# Patient Record
Sex: Male | Born: 1985
Health system: Southern US, Community
[De-identification: ages and names within clinical notes are randomized; demographics above are authoritative.]

## PROBLEM LIST (undated history)

## (undated) DIAGNOSIS — F419 Anxiety disorder, unspecified: Secondary | ICD-10-CM

## (undated) DIAGNOSIS — R011 Cardiac murmur, unspecified: Secondary | ICD-10-CM

## (undated) DIAGNOSIS — K219 Gastro-esophageal reflux disease without esophagitis: Secondary | ICD-10-CM

## (undated) DIAGNOSIS — T7840XA Allergy, unspecified, initial encounter: Secondary | ICD-10-CM

## (undated) HISTORY — PX: VASECTOMY: SHX75

## (undated) HISTORY — DX: Allergy, unspecified, initial encounter: T78.40XA

## (undated) HISTORY — DX: Anxiety disorder, unspecified: F41.9

## (undated) HISTORY — DX: Gastro-esophageal reflux disease without esophagitis: K21.9

## (undated) HISTORY — DX: Cardiac murmur, unspecified: R01.1

---
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
## 2007-02-27 ENCOUNTER — Encounter (INDEPENDENT_AMBULATORY_CARE_PROVIDER_SITE_OTHER): Payer: Self-pay | Admitting: Internal Medicine

## 2007-02-27 ENCOUNTER — Inpatient Hospital Stay (HOSPITAL_COMMUNITY): Admission: EM | Admit: 2007-02-27 | Discharge: 2007-03-01 | Payer: Self-pay | Admitting: Emergency Medicine

## 2007-06-25 ENCOUNTER — Ambulatory Visit: Payer: Self-pay | Admitting: Internal Medicine

## 2007-08-07 ENCOUNTER — Emergency Department: Payer: Self-pay | Admitting: Emergency Medicine

## 2007-12-10 ENCOUNTER — Observation Stay: Payer: Self-pay | Admitting: Internal Medicine

## 2008-04-26 IMAGING — CR DG LUMBAR SPINE AP/LAT/OBLIQUES W/ FLEX AND EXT
1 series · 5 of 5 positions shown · non-contrast
Comparison: none

REASON FOR EXAM: Pain
COMMENTS:

PROCEDURE:     DXR - DXR LUMBAR SPINE WITH OBLIQUES  - June 25, 2007  [DATE]
RESULT:     Five non-rib bearing lumbar vertebral bodies are appreciated.
There does not appear to be evidence of fracture, dislocation or
malalignment. Air is seen within non-dilated loops of bowel.

[Series 1: view not recorded · 0.17mm/px · 5 of 5 slices shown]
[im 1/5]
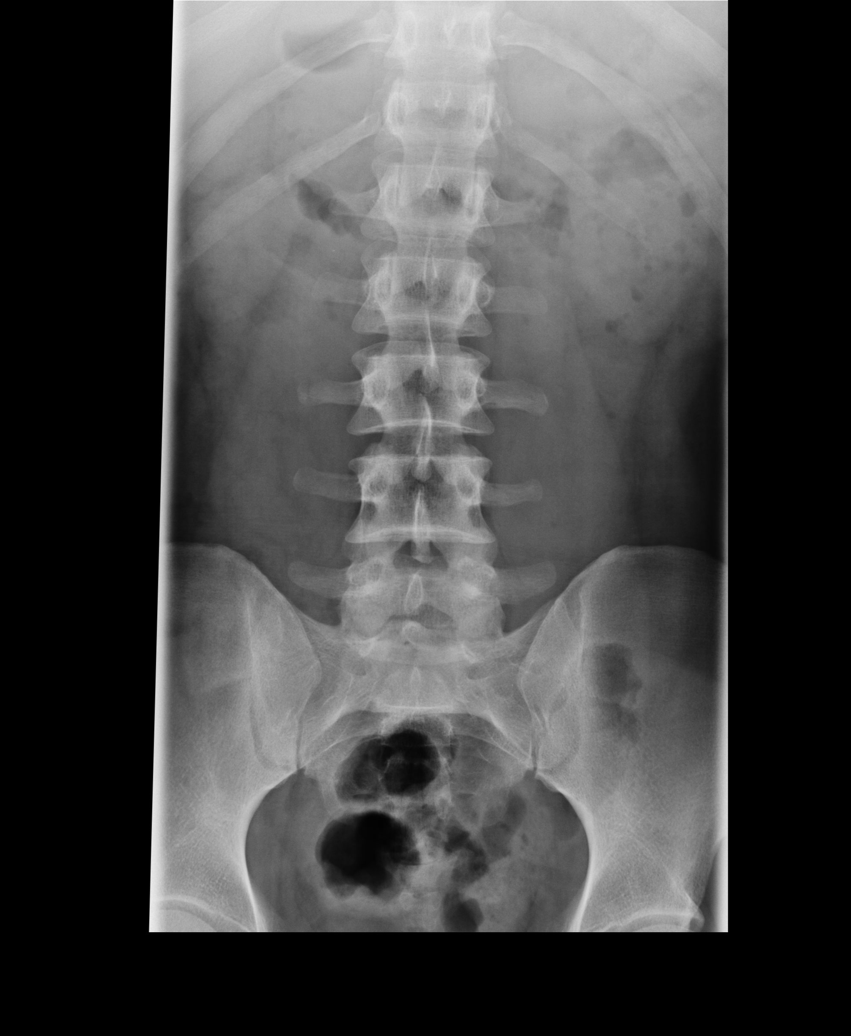
[im 2/5]
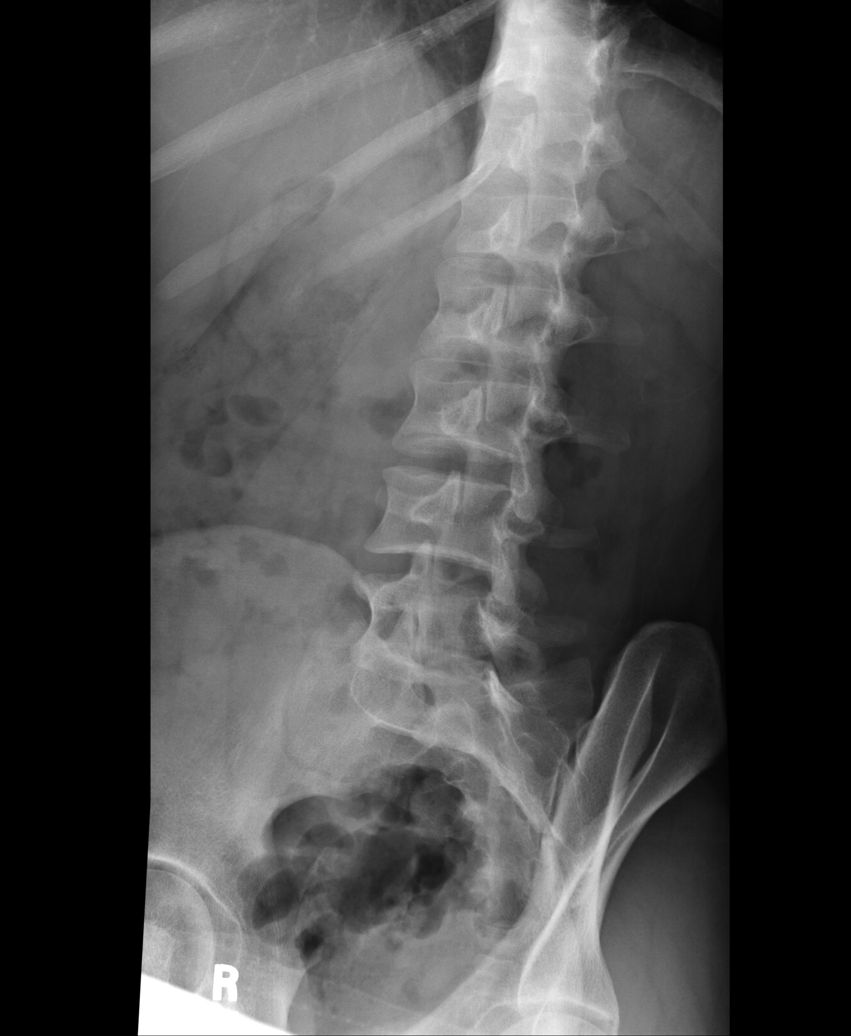
[im 3/5]
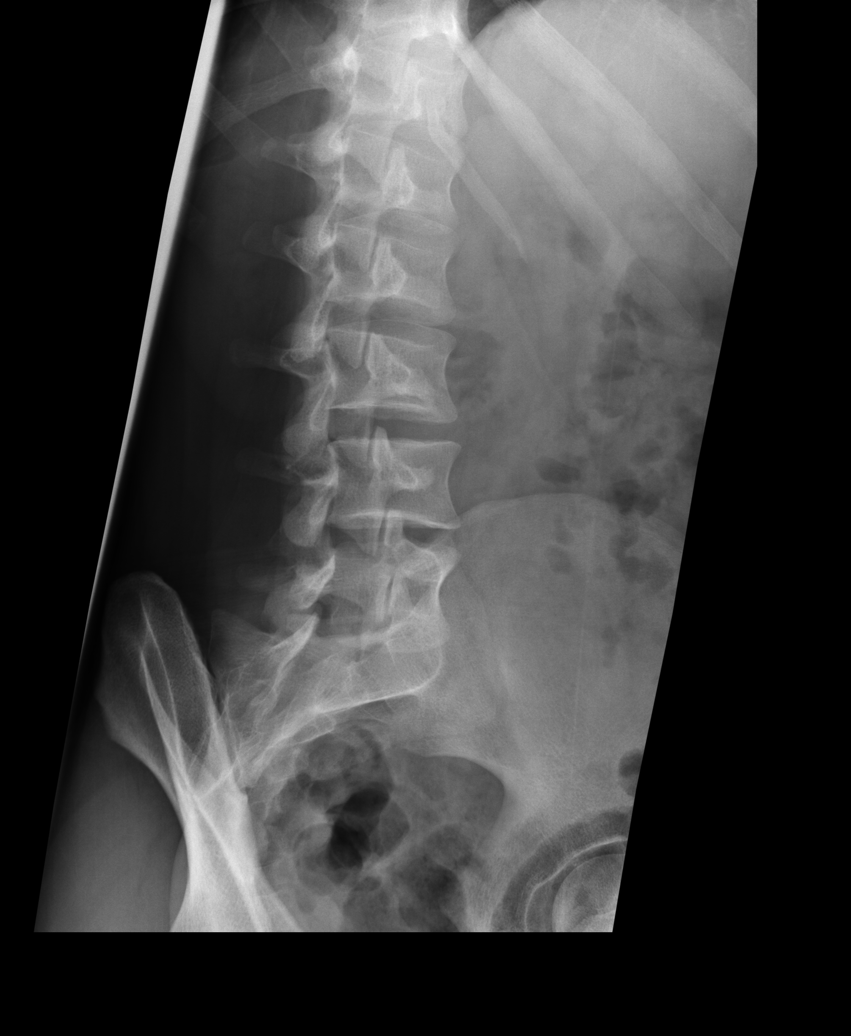
[im 4/5]
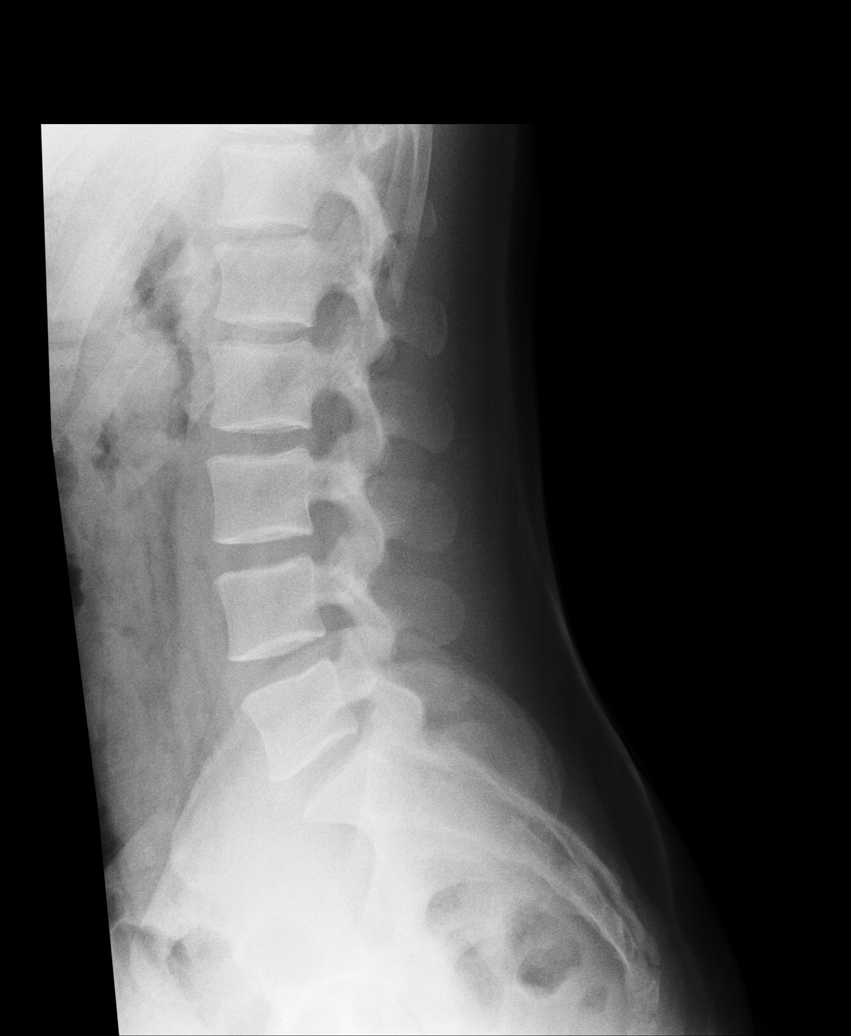
[im 5/5]
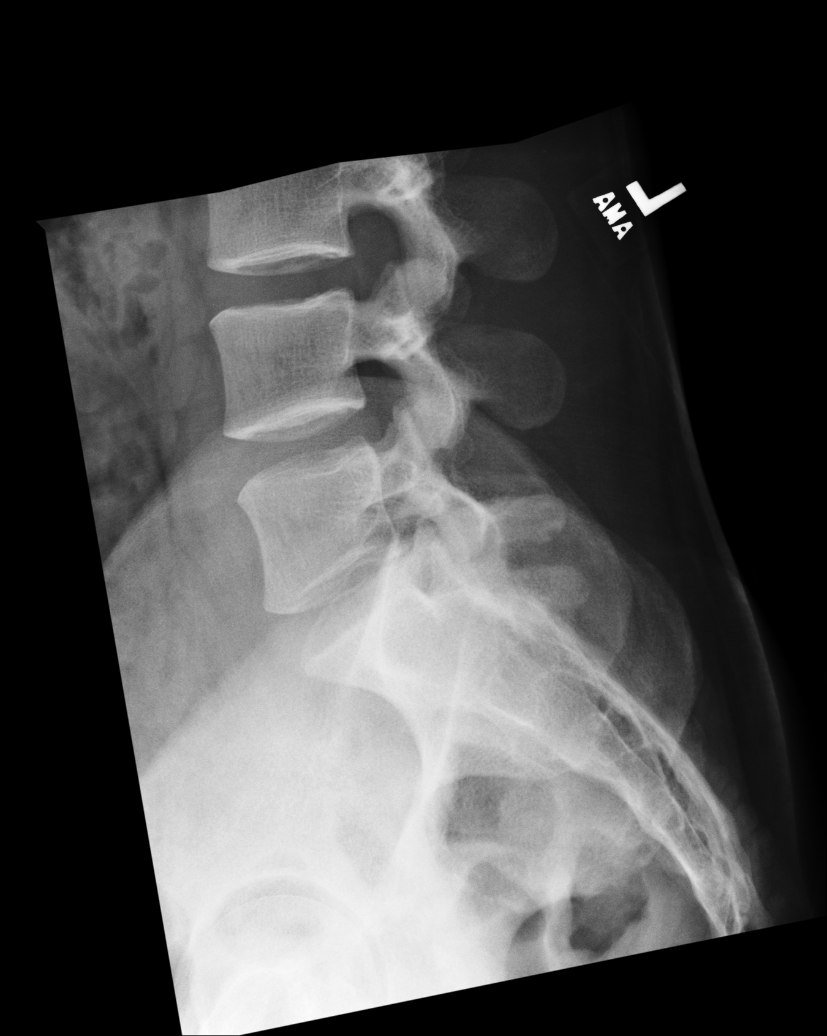

[5 of 5 positions shown; findings below may reference images not displayed]

IMPRESSION: 1.     Unremarkable lumbar spine.
2.     If there is persistent clinical concern or persistent complaints of
pain, repeat evaluation in 7-10 days is recommended or, if clinically
warranted, further evaluation with lumbar MRI.

## 2008-06-20 ENCOUNTER — Emergency Department: Payer: Self-pay | Admitting: Emergency Medicine

## 2008-09-22 ENCOUNTER — Emergency Department (HOSPITAL_COMMUNITY): Admission: EM | Admit: 2008-09-22 | Discharge: 2008-09-23 | Payer: Self-pay | Admitting: Emergency Medicine

## 2008-09-23 ENCOUNTER — Inpatient Hospital Stay (HOSPITAL_COMMUNITY): Admission: AD | Admit: 2008-09-23 | Discharge: 2008-09-29 | Payer: Self-pay | Admitting: Psychiatry

## 2008-09-23 ENCOUNTER — Ambulatory Visit: Payer: Self-pay | Admitting: Psychiatry

## 2008-09-25 ENCOUNTER — Emergency Department (HOSPITAL_COMMUNITY): Admission: EM | Admit: 2008-09-25 | Discharge: 2008-09-25 | Payer: Self-pay | Admitting: Emergency Medicine

## 2008-09-28 ENCOUNTER — Emergency Department (HOSPITAL_COMMUNITY): Admission: EM | Admit: 2008-09-28 | Discharge: 2008-09-28 | Payer: Self-pay | Admitting: Emergency Medicine

## 2009-05-23 ENCOUNTER — Emergency Department (HOSPITAL_COMMUNITY): Admission: EM | Admit: 2009-05-23 | Discharge: 2009-05-24 | Payer: Self-pay | Admitting: Emergency Medicine

## 2010-10-06 LAB — DIFFERENTIAL
Basophils Relative: 0 % (ref 0–1)
Eosinophils Absolute: 0.1 10*3/uL (ref 0.0–0.7)
Eosinophils Relative: 1 % (ref 0–5)
Monocytes Relative: 7 % (ref 3–12)
Neutrophils Relative %: 53 % (ref 43–77)

## 2010-10-06 LAB — COMPREHENSIVE METABOLIC PANEL
ALT: 18 U/L (ref 0–53)
AST: 23 U/L (ref 0–37)
Alkaline Phosphatase: 70 U/L (ref 39–117)
CO2: 29 mEq/L (ref 19–32)
GFR calc non Af Amer: >60 mL/min (ref >60)
Glucose, Bld: 66 mg/dL — ABNORMAL LOW (ref 70–99)
Potassium: 3.7 mEq/L (ref 3.5–5.1)
Sodium: 135 mEq/L (ref 135–145)
Total Protein: 7.6 g/dL (ref 6.0–8.3)

## 2010-10-06 LAB — CBC
Hemoglobin: 15.7 g/dL (ref 13.0–17.0)
RBC: 4.94 MIL/uL (ref 4.22–5.81)

## 2010-10-06 LAB — RAPID URINE DRUG SCREEN, HOSP PERFORMED
Opiates: NOT DETECTED
Tetrahydrocannabinol: NOT DETECTED

## 2010-10-06 LAB — ETHANOL: Alcohol, Ethyl (B): <5 mg/dL (ref 0–10)

## 2010-10-14 LAB — COMPREHENSIVE METABOLIC PANEL
ALT: 28 U/L (ref 0–53)
Alkaline Phosphatase: 56 U/L (ref 39–117)
BUN: 13 mg/dL (ref 6–23)
CO2: 27 mEq/L (ref 19–32)
Chloride: 105 mEq/L (ref 96–112)
Glucose, Bld: 94 mg/dL (ref 70–99)
Potassium: 3.6 mEq/L (ref 3.5–5.1)
Sodium: 140 mEq/L (ref 135–145)
Total Bilirubin: 0.6 mg/dL (ref 0.3–1.2)
Total Protein: 7.1 g/dL (ref 6.0–8.3)

## 2010-10-14 LAB — ETHANOL: Alcohol, Ethyl (B): <5 mg/dL (ref 0–10)

## 2010-10-14 LAB — RPR: RPR Ser Ql: NONREACTIVE

## 2010-10-14 LAB — DIFFERENTIAL
Basophils Absolute: 0 10*3/uL (ref 0.0–0.1)
Basophils Absolute: 0 10*3/uL (ref 0.0–0.1)
Basophils Relative: 0 % (ref 0–1)
Basophils Relative: 0 % (ref 0–1)
Eosinophils Absolute: 0.1 10*3/uL (ref 0.0–0.7)
Eosinophils Absolute: 0.1 10*3/uL (ref 0.0–0.7)
Monocytes Absolute: 0.8 10*3/uL (ref 0.1–1.0)
Monocytes Relative: 8 % (ref 3–12)
Neutro Abs: 6.5 10*3/uL (ref 1.7–7.7)
Neutro Abs: 4.6 10*3/uL (ref 1.7–7.7)
Neutrophils Relative %: 62 % (ref 43–77)
Neutrophils Relative %: 58 % (ref 43–77)

## 2010-10-14 LAB — RAPID URINE DRUG SCREEN, HOSP PERFORMED
Barbiturates: NOT DETECTED
Cocaine: NOT DETECTED
Opiates: NOT DETECTED
Tetrahydrocannabinol: NOT DETECTED

## 2010-10-14 LAB — CK: Total CK: 1567 U/L — ABNORMAL HIGH (ref 7–232)

## 2010-10-14 LAB — POCT I-STAT, CHEM 8
Chloride: 104 mEq/L (ref 96–112)
Creatinine, Ser: 1.5 mg/dL (ref 0.4–1.5)
Glucose, Bld: 92 mg/dL (ref 70–99)
HCT: 51 % (ref 39.0–52.0)
HCT: 45 % (ref 39.0–52.0)
Hemoglobin: 17.3 g/dL — ABNORMAL HIGH (ref 13.0–17.0)
Hemoglobin: 15.3 g/dL (ref 13.0–17.0)
Potassium: 3.5 mEq/L (ref 3.5–5.1)
Potassium: 4.1 mEq/L (ref 3.5–5.1)
Sodium: 142 mEq/L (ref 135–145)
Sodium: 139 mEq/L (ref 135–145)
TCO2: 26 mmol/L (ref 0–100)

## 2010-10-14 LAB — URINALYSIS, ROUTINE W REFLEX MICROSCOPIC
Bilirubin Urine: NEGATIVE
Glucose, UA: NEGATIVE mg/dL
Hgb urine dipstick: NEGATIVE
Specific Gravity, Urine: 1.022 (ref 1.005–1.030)
Urobilinogen, UA: 0.2 mg/dL (ref 0.0–1.0)
pH: 6.5 (ref 5.0–8.0)

## 2010-10-14 LAB — CBC
HCT: 46.8 % (ref 39.0–52.0)
Hemoglobin: 16.3 g/dL (ref 13.0–17.0)
Hemoglobin: 14.4 g/dL (ref 13.0–17.0)
MCHC: 34 g/dL (ref 30.0–36.0)
RBC: 5.07 MIL/uL (ref 4.22–5.81)
RDW: 13.7 % (ref 11.5–15.5)
WBC: 10.6 10*3/uL — ABNORMAL HIGH (ref 4.0–10.5)

## 2010-10-14 LAB — ACETAMINOPHEN LEVEL: Acetaminophen (Tylenol), Serum: <10 ug/mL — ABNORMAL LOW (ref 10–30)

## 2010-10-14 LAB — TSH: TSH: 2.614 u[IU]/mL (ref 0.350–4.500)

## 2010-10-14 LAB — T4, FREE: Free T4: 1.27 ng/dL (ref 0.89–1.80)

## 2010-11-16 NOTE — Discharge Summary (Signed)
[name], [name]                ACCOUNT NO.:  [id number]   MEDICAL RECORD NO.:  [id number]          PATIENT TYPE:  INP   LOCATION:  4733                         FACILITY:  [facility]   PHYSICIAN:  Mobolaji B. Bakare, M.D.DATE OF BIRTH:  1986/06/17   DATE OF ADMISSION:  02/26/2007  DATE OF DISCHARGE:                               DISCHARGE SUMMARY   PRIMARY CARE PHYSICIAN:  Dr. Beverely Risen in [place], [place].   FINAL DIAGNOSES:  1. Vasovagal syncope.  2. Migraine headaches.  3. Sinus bradycardia, asymptomatic.   PROCEDURES:  1. Head CT scan done on February 27, 2007, showed no acute intracranial      abnormality.  2. MRI of the head done on February 27, 2007, showed no acute      abnormality; it was a normal study.  3. EEG done on February 27, 2007, was a normal study in the awake,      drowsy and light sleep states.  4. A 2-D echocardiogram done on February 27, 2007, reported normal left      ventricular systolic function with estimated ejection fraction of      55-65%.  There was right ventricular enlargement but normal RV      pressure and normal RV systolic function.   BRIEF HISTORY:  [name] [name] is a pleasant 25 year old Caucasian male who  was visiting his girlfriend in [place].  About 8:45 p.m. on the day  of admission the patient was noted to have had a blackout while sitting.  This period of blackout/unresponsiveness appears to have been less than  1 minute.  The patient came around and developed a frontal headache.  No  change in his vision.  He had no nausea/vomiting.  During the course of  hospitalization he had photophobia and phonophobia.  He does have  history of migraine headaches.   Upon arrival in the emergency room, temperature was 99.6, blood pressure  127/79, blood pressure 98/45, pulse of 62, he was in sinus rhythm, O2  saturations of 98%.  Physical examination was unremarkable.  He was  oriented in time, place and person.  He underwent head CT scan  which was  normal and the patient was admitted to telemetry for further evaluation.  His initial EKG showed sinus bradycardia, 50 beats per minute, with  early repolarization, no acute ischemic changes.   HOSPITAL COURSE:  #1 - VASOVAGAL SYNCOPE.  The patient was admitted for  syncopal workup.  He was not orthostatic.  He was on telemetry for the  period of hospitalization.  There was no malignant arrhythmia on  telemetry.  He had sinus bradycardia but this apparently was  asymptomatic.  His heart rate was in the mid 50s.  The patient had a 2-D  echocardiogram which showed a dilated right ventricle but normal  pressure and function.  Right ventricular dysplasia was a consideration  in the setting of syncope.  However, the patient did not demonstrate any  ventricular tachycardia on telemetry.   Given the history of headaches surrounding the onset of syncope, it was  felt that this could represent vasovagal  syncope.  He did not have any  recurrence of symptoms during the course of hospitalization.   #2 - MIGRAINE HEADACHES.  The patient does have a history of migraine  and he gets relief from Imitrex.  He was given Imitrex and Vicodin  p.r.n.  Headaches have resolved, no more phonophobia or photophobia.   DISCHARGE CONDITION:  Stable.   VITAL SIGNS AT THE TIME OF DISCHARGE:  Temperature 97, pulse 69, O2  saturations 96% on room air, blood pressure 126/57, respiratory rate 20.   DISCHARGE MEDICATIONS:  1. Vicodin one to two p.o. p.r.n. q.4h.  2. Zantac 150 mg daily.  3. Vitamin one daily.   FOLLOWUP:  With Dr. Welton Flakes.      Mobolaji B. Corky Downs, M.D.  Electronically Signed     MBB/MEDQ  D:  03/01/2007  T:  03/02/2007  Job:  [id number]   cc:   Beverely Risen, MD

## 2010-11-16 NOTE — H&P (Signed)
NAMEKARSIN, PESTA NO.:  [id number]   MEDICAL RECORD NO.:  [id number]          PATIENT TYPE:  IPS   LOCATION:  0501                          [facility]ACILITY[facility]  [facility]   PHYSICIAN:  Geoffery Lyons, M.D.      DATE OF BIRTH:  04-May-1986   DATE OF ADMISSION:  09/23/2008  DATE OF DISCHARGE:                       PSYCHIATRIC ADMISSION ASSESSMENT   IDENTIFYING INFORMATION:  This is a 25 year old male.  This is a  voluntary admission.   HISTORY OF THE PRESENT ILLNESS:  First inpatient psychiatric admission  for this 25 year old who came to the emergency room accompanied by his  father who was concerned about the amount of medications that he had  taken.  Initially reported that he had taken 25 tablets of Xanax 1 mg,  20 tablets of Ativan 1 mg, and 8 tablets of clonazepam 1 mg.  Today he  confirms that he took more than 10 or 12 Xanax 1-mg tablets and also  took some Klonopin, cannot remember when and is unclear about any other  additional medications.  Denies that he has taken any pain relievers,  Tylenol or ibuprofen.  He reports that he was very anxious yesterday and  had an argument with his girlfriend although he is unable to remember  exactly what it was about, said that he was overwhelmed with anxiety and  was attempting to get relief.  He also complains of chronic pain with  generalized pain in his back and then on further description points to  his shoulders and down to the floor describing pain all over his body.  He denies any hallucinations.  Denies any homicidal thoughts.   PAST PSYCHIATRIC HISTORY:  He reports a history of anxiety since age 55.  No prior inpatient psychiatric admissions.  Currently followed as an  outpatient by Johnell Comings, P.A.  He is not currently receiving any  counseling.  He has been treated in the past with Klonopin which he is  not currently taking and currently had been prescribed Xanax as needed  to cope with panic or acute anxiety.  Within  the past 2 weeks, he had  been started on Celexa 20 mg daily.  He has a history of previous trials  of Seroquel and reports having a bad withdrawal after stopping that in  the past, does not want to resume it.  No prior known suicide attempts.   SOCIAL HISTORY:  Single 25 year old, is living with parents.  Currently  attending the [employer].  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  Also has a part-time job.  In a 3-year relationship with a  girlfriend with some recent chronic friction.  Never married.  No  children.   MEDICAL HISTORY:  Primary care practitioner is a pediatrician at [facility].   CURRENT MEDICAL PROBLEMS:  Chronic backache, NOS.   PAST MEDICAL HISTORY:  1. Migraine headaches treated in the past with Imitrex.  2. Hospitalized in August of 2009 for vasovagal syncope episode.  3. Noted to have sinus bradycardia in the past.  4. Previous EEG done in August of 2009  negative for any acute      findings.   CURRENT MEDICATIONS:  Celexa 20 mg daily.   DRUG ALLERGIES:  ERYTHROMYCIN.   POSITIVE PHYSICAL FINDINGS:  Physical exam was done in the emergency  room.  This is a healthy-appearing, muscular, slim-built 25 year old in  no acute distress.  Does appear quite anxious with a rigid posture and  complaining of back pain.  Positive for some muscle tenderness along his  left paraspinal muscles, T10-L4, but no acute tenderness.  Denying any  incontinence.  Afebrile with a normal urinalysis and normal  comprehensive metabolic panel.  Urine drug screen positive for  benzodiazepines.   MENTAL STATUS EXAM:  Reveals a rigid posture.  Appears quite tense,  readily tearful.  Concentration is decreased.  Speech is non-pressured,  normal in form, production.  Having difficulty answering details about,  for example, exactly how many pills he took, quite anxious, having to  search his memory and then feeling that he cannot remember.  Appears  distracted due to his  anxiety.  Mood is anxious.  Thought process  logical and coherent.  Describes himself as quite fearful about pain  although his descriptions of pain tend to be somewhat nonspecific.  Gait  is normal.  Normal motor.  Oriented x4.  No signs of confusion or  delirium.  AXIS I:  Rule out generalized anxiety disorder.  AXIS II:  Deferred.  AXIS III:  Back pain, NOS.  AXIS IV:  Deferred.  AXIS V:  Current 40, past year not known.   PLAN:  Voluntarily admit him with every 15-minute checks in place to  alleviate suicidal thoughts and control his anxiety.  We are going to  continue to do some basic labs including a TSH, an RPR, and a free T4.  We will start him on Skelaxin 800 mg t.i.d. p.r.n. for muscle spasms and  we will give him 800 mg, gabapentin 100 mg q.i.d. with first dose now,  holding off on his Celexa until we get a better feel for his anxiety.  Hope to get his parents involved in treatment and he is on a Librium  protocol to safely step him down from benzodiazepines that he has been  taking.      Margaret A. Scott, N.P.      Geoffery Lyons, M.D.  Electronically Signed    MAS/MEDQ  D:  09/24/2008  T:  09/24/2008  Job:  [id number]

## 2010-11-16 NOTE — H&P (Signed)
NAME:  [name] [name], [name] [name] NO.:  [id number]   MEDICAL RECORD NO.:  [id number]          PATIENT TYPE:  EMS   LOCATION:  MAJO                         FACILITY:  [facility]   PHYSICIAN:  Marcellus Scott, MD     DATE OF BIRTH:  Jun 10, 1986   DATE OF ADMISSION:  02/27/2007  DATE OF DISCHARGE:                              HISTORY & PHYSICAL   PRIMARY CARE PHYSICIAN:  [place] Liz [place]  and hence is unassigned.   CHIEF COMPLAINT:  Altered mental status/unresponsiveness/black out.   HISTORY OF PRESENT ILLNESS:  [name] is a pleasant 25 year old male  patient with no known significant past medical history.  History is  being provided by the patient's father who is at the bedside.  The  patient, over the last few days, has been appearing tired, slightly  listless.  However, he has not complained of any specific complaints  such as fever, chills, rigors, headache, earache, sore throat, cough,  chest pain, palpitations, nausea, vomiting or diarrhea.  Be that it may,  last night at approximately 8:45 p.m., the patient spoke with his father  via phone and apart from sounding tired seemed alright.  The patient was  at his girlfriend's house after returning from school.  However, within  the next 15 minutes, apparently the patient asked his girlfriend to call  9-1-1 and subsequently when the girlfriend went for the phone and  returned, found the patient unresponsive.  The patient's sisters, who is  an EMT, was called from next door.  There was no seizure-like activity  noted.  No urinary incontinence or frothing at the mouth or tongue  bites.  Sternal rub was performed with little response. EMS was called  and within  the next few minutes, the patient, to sternal rub, started  opening his eyes and raising his head.  The patient was subsequently  transferred to the emergency room.  Since being in the emergency room,  the patient has gradually woken up and  become more alert.  More  responsive in terms of answering question and obeying commands and seems  appropriate.   PAST MEDICAL HISTORY:  None.   PAST SURGICAL HISTORY:  Left breast cyst removal four years ago.   PSYCHIATRIC HISTORY:  History of anxiety and depression.  No history of  suicidal ideations, delusions, hallucinations or homicidal ideations.   ALLERGIES:  Erythromycin causes rash.   MEDICATIONS:  1. Xanax 0.5 mg p.o. p.r.n. which he had discontinued two weeks ago.  2. The patient has also stopped taking Zoloft since December 2007 and      Depakote for the last four months.  3. Zantac.  4. Lunesta p.r.n.   FAMILY HISTORY:  Noncontributory.  Specifically, there is no history of  seizures.   SOCIAL HISTORY:  The patient is single.  He works in the day doing lawn  care and also works out in [employer] a few days a week and then attends  school a couple of days a week.  According to his father, the patient,  over the last week, has  been appearing listless and a little fatigued.  The patient says he smokes occasionally, but there is no history of  alcohol abuse or drug abuse.   REVIEW OF SYSTEMS:  14 systems reviewed and apart from history of  present illness is pertinent for injury by lawn equipment on the right  side of the cheek about two weeks ago, but no history of head injury.  Otherwise, review of systems is noncontributory.   PHYSICAL EXAMINATION:  GENERAL:  [name] is well built and nourished  male patient in no obvious distress.  VITAL SIGNS:  Temperature 99.6, blood pressure 127/79 and now 98/45  mmHg, pulse 62 and regular, respirations 18, saturating at 98% on room  air.  HEENT:  Normocephalic, atraumatic.  Pupils equal and reactive to light  and accommodation.  No oropharyngeal erythema or thrush.  NECK:  Without JVD, carotid bruit, lymphadenopathy or goiter.  Supple.  LYMPHATICS:  No lymphadenopathy.  RESPIRATORY:  Clear to auscultation bilaterally.   CARDIOVASCULAR:  First and second heart sounds are heard.  No third or  fourth heart sounds.  No murmurs, rubs, gallops or clicks.  ABDOMEN:  Nondistended, nontender.  No organomegaly or mass appreciated.  Bowel sounds are normally heard.  CENTRAL NERVOUS SYSTEM:  The patient is still slightly drowsy, but  easily arousable.  Oriented to time, person and place, obeying commands  appropriately.  No cranial nerve deficits or focal neurological  deficits.  EXTREMITIES:  No cyanosis, clubbing or edema.  Peripheral pulses are  symmetrically felt.  SKIN:  Without any rashes.   LABORATORY DATA:  CT of the head, noncontrast is normal.  CBC is normal  with hemoglobin 15.4, hematocrit 44, white blood cell 8.8, platelets  192.  Basic metabolic panel with sodium 136, potassium 3.2, chloride  102, bicarbonate 25, glucose 100, BUN 16, creatinine 1.10, calcium 9.1,  total protein 6.9, albumin 4.3, AST 21, ALT 18.  Urine toxicology screen  is negative.   ASSESSMENT/PLAN:  1. Syncope.  Unclear etiology.  To rule out seizures.  Will admit to      telemetry.  Will check orthostatic blood pressures.  Will obtain an      EKG, echocardiogram and an EEG. Monitor with frequent neuro checks.      Fall precautions and seizure precautions.  2. Hypokalemia.  For repleting and monitor basic metabolic panel and      magnesium.  3. Anxiety and depression.  Will hold Xanax at this time secondary to      the patient's mental status.   EKG: sinus brady in 50's.      Marcellus Scott, MD  Electronically Signed     AH/MEDQ  D:  02/27/2007  T:  02/27/2007  Job:  [id number]   cc:   Nicholes Rough, [place] Dr. Liz Beach

## 2010-11-16 NOTE — Procedures (Signed)
EEG NUMBER:  [id number].   REQUESTING PHYSICIAN:  Elliot Cousin, M.D.   ATTENDING PHYSICIAN:  Marcellus Scott, MD   CLINICAL HISTORY:  This is a routine EEG done with photic stimulation  but not hyperventilation.  The patient is described as awake and asleep.  This is a 25 year old man with syncope versus seizure.  EEG is performed  for evaluation.   DESCRIPTION:  The dominant waking background is seen only in fragments.  It is a low-amplitude alpha rhythm of 9-10 Hz which predominates  posteriorly.  Most of the record is recorded in the drowsy state,  identifying fragmentation in the background and appearance of a  generalized theta rhythm of 7-8 Hz, which appears diffusely.  In deep  stages of drowsiness and light sleep the background falls away entirely  and some low-amplitude theta, delta rhythms are seen diffusely.  No  focal slowing is noted and no epileptiform discharges are seen.  Photic  stimulation produced symmetric driving responses.  Hyperventilation was  not performed.  A single channel devoted to EKG revealed sinus  bradycardia throughout with a rate of approximately 54 beats per minute.   CONCLUSIONS:  Normal study in the awake, drowsy, and light sleep states.  Incidental note is made of sinus bradycardia.      Michael L. Thad Ranger, M.D.  Electronically Signed     XBJ:YNWG  D:  02/27/2007 20:47:08  T:  02/28/2007 13:06:09  Job #:  [id number]

## 2010-11-19 NOTE — Discharge Summary (Signed)
[name] [name], MULLENS NO.:  [id number]   MEDICAL RECORD NO.:  [id number]         PATIENT TYPE:  BIPS   LOCATION:                                FACILITY:  [facility]   PHYSICIAN:  Geoffery Lyons, M.D.      DATE OF BIRTH:  [birth year]   DATE OF ADMISSION:  09/23/2008  DATE OF DISCHARGE:  09/29/2008                               DISCHARGE SUMMARY   CHIEF COMPLAINT:  This was the first admission to [facility] for this 25 year old male voluntarily admitted to the emergency  room accompanied by his father concerned about the amount of medication  he had been taking.  Initially reported he had taken 25 tablets of Xanax  1 mg per day, 20 tablets of Ativan 1 mg and 8 tablets of clonazepam 1  mg.  Upon this assessment, he confirms he took more than 10 or 12 Xanax  1 mg and took some Klonopin.  Cannot remember when and is unclear about  any other additional medications.  Endorsed he was very anxious the day  before, and had an argument with his girlfriend.  Unable to remember  exactly what it was about.  Endorsed anxiety, feeling overwhelmed.  Endorsed chronic pain, generalized pain in his back and all over.   PAST PSYCHIATRIC HISTORY:  History of anxiety since age 56.  Followed on  an outpatient by Margie Billet [place].  No counseling.  He had been prescribed  Klonopin earlier and then more recently Xanax.  The last 2 weeks has  started on Celexa 20.  Has history of previous trials on Seroquel.   ALCOHOL AND DRUG HISTORY:  As already stated.  Possible abuse of  benzodiazepines.   MEDICAL HISTORY:  Migraine headaches treated in the past with Imitrex.  August 2009 vasovagal syncope.  Sinus bradycardia then.   MEDICATIONS:  Celexa 20 mg per day.   PHYSICAL EXAM:  Failed to show any acute findings.   LABORATORY WORK:  PSA 2.64.   MENTAL STATUS EXAMINATION:  Reveals alert cooperative male who appears  quite tense.  Readily tearful, decreased concentration.  Speech is  not  pressured, normal in tone and production.  Difficulty answering details,  how many pills he took, having to search his memory and then feeling he  cannot remember.  Endorsed persistent anxiety.  Affect, anxiety.  Thought processes logical, coherent and relevant.  Nonspecific  complaints of pain.  No suicidal ideas, no delusions.  No  hallucinations.  Cognition well preserved.   ADMISSION DIAGNOSES:  AXIS I:  Generalized anxiety disorder, rule out  benzodiazepine abuse.  AXIS II:  No diagnosis.  AXIS III:  Back pain.  AXIS IV:  Moderate.  AXIS V:  On admission 35.  Highest GAF in the last year 60.   COURSE IN THE HOSPITAL:  Was admitted, started individual and group  psychotherapy.  He was given some Ambien for sleep.  We used Librium to  detox him from the benzodiazepine.  He was also given some Neurontin.  Endorsed he was prescribed benzodiazepines due to anxiety and endorsed  a  lot of depression, lots of stress.  He was in the [employer] in  [place].  He was in college for a business degree.  Found out  that this is not what he wanted to do.  Endorsed long-term history of  depression and anxiety since he really young.  Has taken up to 200 of  Seroquel.  Sees Misty Stanley [place].  Zoloft caused side effect of diarrhea.  Strong family history of anxiety and depression and some family history  of alcohol.  Mother shared that Margie Billet [place] was treating Jonny Ruiz for  bipolar disorder and anxiety.  Apparently, he would go through the whole  entire prescription for the benzodiazepines in 3 days.  Apparently had  been doing well with the [employer].  March 25 he endorsed that he  knows that the worst will be over in a couple of days.  Complained of  aches, mainly back pain.  Endorsed persistent anxiety, panic attacks.  Claimed that he overtook the benzodiazepine not to kill himself but  dealing with the anxiety.  He continued to be somatically focused.  We  pursued the detox.  We  tried to reassess the possibility of any  comorbidity in terms of a mood disorder like bipolar disorder.  Apparently, the behavior described by the parents is that when he takes  and it does not work immediately, he takes another one and another one.  He has a prior history of polysubstance abuse.  Mother wanting to be the  one dispensing the medication, but they end up fighting.  March 26, he  report not being able to sleep but 1-1/2 hours still experiencing acute  panic attacks, needing a lot of staff intervention to talk him through  them.  Endorsed headaches, back pain, shoulder pain, leg pain, nausea,  overwhelmed with the anxiety and felt hopeless and helplessness.  We  worked with trazodone.  We worked with Seroquel.  He apparently went off  Seroquel to join the service.  We worked with CBT and relaxation  techniques.  There was a family session with his parents on March 28.  He did report feeling better, less depressed.  Anxiety has better  control.  Endorsed that he was able to talk himself through a panic  attack and that he had gotten a lot out of the groups.  Endorsed that he  was keeping a lot of this to himself.  He was not sharing all of the  stressors.  He did admit that he would take the Xanax to try to get the  anxiety under control, and he would run out early, and he will just wait  for the next prescription.  March 29, he was in full contact reality.  Objectively, there was a marked difference from admission.  He looked  more relaxed.  He exhibited broader affect, bright, and they were not  effective anyway, so he was pretty satisfied with the news that he  learned that he was going to continue practice and he was going to  continue to see Margie Billet [place] and get in psychotherapy as well as the  possibility of using biofeedback.   DISPOSITION:  Upon discharge in full contact with reality.  Improved  mood.  Improved affect.  No suicidal or homicidal ideas.  Willing and   motivated to pursue outpatient treatment.   DISCHARGE DIAGNOSES:  AXIS I:  Generalized anxiety disorder, panic  attack disorder, benzodiazepine abuse.  AXIS II:  No  diagnosis.  AXIS III:  Back pain.  AXIS IV:  Moderate.  AXIS V:  Upon discharge 55-60.   DISCHARGE MEDICATIONS:  1. Neurontin 300 mg three times a day and 400 mg at bedtime.  2. Rozerem 8 mg at bedtime.  3. Ultram 50 one to two every 6 hours as needed for pain.  4. Zantac one daily.   FOLLOW[place]ie B[place] in therapy.  Encouraged to pursue 12-step  program.      Geoffery Lyons, M.D.  Electronically Signed     IL/MEDQ  D:  10/27/2008  T:  10/27/2008  Job:  [id number]

## 2011-04-15 LAB — COMPREHENSIVE METABOLIC PANEL
ALT: 18
AST: 21
Albumin: 4.3
Alkaline Phosphatase: 74
BUN: 16
Chloride: 102
Potassium: 3.2 — ABNORMAL LOW
Sodium: 136
Total Bilirubin: 0.7
Total Protein: 6.9

## 2011-04-15 LAB — CBC
HCT: 44
Hemoglobin: 14.9
MCHC: 35.2
MCV: 88.6
Platelets: 192
RBC: 4.78
RDW: 12.4
RDW: 12.4
WBC: 8.8

## 2011-04-15 LAB — RAPID URINE DRUG SCREEN, HOSP PERFORMED
Amphetamines: NOT DETECTED
Barbiturates: NOT DETECTED
Benzodiazepines: NOT DETECTED
Opiates: NOT DETECTED

## 2011-04-15 LAB — I-STAT 8, (EC8 V) (CONVERTED LAB)
BUN: 16
Bicarbonate: 24
Chloride: 104
Glucose, Bld: 98
HCT: 48
Operator id: 192351
pCO2, Ven: 41.9 — ABNORMAL LOW

## 2011-04-15 LAB — DIFFERENTIAL
Basophils Absolute: 0
Basophils Relative: 0
Eosinophils Absolute: 0
Eosinophils Relative: 1
Monocytes Absolute: 0.7
Monocytes Relative: 7
Neutro Abs: 6

## 2011-04-15 LAB — BASIC METABOLIC PANEL
CO2: 25
Chloride: 108
Creatinine, Ser: 1.05
GFR calc Af Amer: >60
Glucose, Bld: 88
Sodium: 138

## 2011-04-15 LAB — ETHANOL: Alcohol, Ethyl (B): <5

## 2011-04-15 LAB — MAGNESIUM: Magnesium: 2.3

## 2013-11-05 ENCOUNTER — Emergency Department: Payer: Self-pay | Admitting: Emergency Medicine

## 2016-09-08 ENCOUNTER — Encounter: Payer: Self-pay | Admitting: Urology

## 2016-09-08 ENCOUNTER — Ambulatory Visit: Payer: Commercial Managed Care - HMO | Admitting: Urology

## 2016-09-08 VITALS — BP 141/79 | HR 120 | Ht 69.75 in | Wt 201.9 lb

## 2016-09-08 DIAGNOSIS — Z3009 Encounter for other general counseling and advice on contraception: Secondary | ICD-10-CM

## 2016-09-08 MED ORDER — DIAZEPAM 10 MG PO TABS: 10.0000 mg | ORAL_TABLET | Freq: Once | ORAL | 0 refills | Status: AC

## 2016-09-08 NOTE — Progress Notes (Signed)
09/08/2016 3:37 PM   [name] GoltzJohn C Helsley 02-07-1986 [id number]  Referring provider: No referring provider defined for this encounter.  Chief Complaint  Patient presents with  . VAS Consult    HPI: The patient is a 31 year old male presents today for evaluation for a vasectomy.  He has 3 children him and his wife desire no further children. He has no genitourinary problems. He has no urinary complaints. He voids well.   PMH: No past medical history on file.  Surgical History: No past surgical history on file.  Home Medications:  Allergies as of 09/08/2016      Reactions   Erythromycin Rash      Medication List       Accurate as of 09/08/16  3:37 PM. Always use your most recent med list.          diazepam 10 MG tablet Commonly known as:  VALIUM Take 1 tablet (10 mg total) by mouth once.   esomeprazole 20 MG capsule Commonly known as:  NEXIUM Take 20 mg by mouth daily at 12 noon.   ZYRTEC-D PO Take 1 tablet by mouth.       Allergies:  Allergies  Allergen Reactions  . Erythromycin Rash    Family History: No family history on file.  Social History:  reports that he has never smoked. He has never used smokeless tobacco. He reports that he drinks alcohol. He reports that he does not use drugs.  ROS: UROLOGY Frequent Urination?: No Hard to postpone urination?: No Burning/pain with urination?: No Get up at night to urinate?: Yes Leakage of urine?: No Urine stream starts and stops?: No Trouble starting stream?: No Do you have to strain to urinate?: No Blood in urine?: No Urinary tract infection?: No Sexually transmitted disease?: No Injury to kidneys or bladder?: No Painful intercourse?: No Weak stream?: No Erection problems?: No Penile pain?: No  Gastrointestinal Nausea?: No Vomiting?: No Indigestion/heartburn?: Yes Diarrhea?: No Constipation?: No  Constitutional Fever: No Night sweats?: No Weight loss?: No Fatigue?: No  Skin Skin  rash/lesions?: No Itching?: No  Eyes Blurred vision?: No Double vision?: No  Ears/Nose/Throat Sore throat?: No Sinus problems?: Yes  Hematologic/Lymphatic Swollen glands?: No Easy bruising?: No  Cardiovascular Leg swelling?: No Chest pain?: No  Respiratory Cough?: No Shortness of breath?: No  Endocrine Excessive thirst?: No  Musculoskeletal Back pain?: No Joint pain?: No  Neurological Headaches?: No Dizziness?: No  Psychologic Depression?: No Anxiety?: Yes  Physical Exam: BP (!) 141/79   Pulse (!) 120   Ht 5' 9.75" (1.772 m)   Wt 201 lb 14.4 oz (91.6 kg)   BMI 29.18 kg/m   Constitutional:  Alert and oriented, No acute distress. HEENT: [place] AT, moist mucus membranes.  Trachea midline, no masses. Cardiovascular: No clubbing, cyanosis, or edema. Respiratory: Normal respiratory effort, no increased work of breathing. GI: Abdomen is soft, nontender, nondistended, no abdominal masses GU: No CVA tenderness. Normal phallus. Vas palpable bilaterally easily. Normal testicles without masses. Skin: No rashes, bruises or suspicious lesions. Lymph: No cervical or inguinal adenopathy. Neurologic: Grossly intact, no focal deficits, moving all 4 extremities. Psychiatric: Normal mood and affect.  Laboratory Data: Lab Results  Component Value Date   WBC 7.9 05/23/2009   HGB 15.7 05/23/2009   HCT 45.5 05/23/2009   MCV 92.2 05/23/2009   PLT 188 05/23/2009    Lab Results  Component Value Date   CREATININE 1.09 05/23/2009    No results found for: PSA  No results found  for: TESTOSTERONE  No results found for: HGBA1C  Urinalysis    Component Value Date/Time   COLORURINE YELLOW 09/22/2008 2350   APPEARANCEUR CLEAR 09/22/2008 2350   LABSPEC 1.022 09/22/2008 2350   PHURINE 6.5 09/22/2008 2350   GLUCOSEU NEGATIVE 09/22/2008 2350   HGBUR NEGATIVE 09/22/2008 2350   BILIRUBINUR NEGATIVE 09/22/2008 2350   KETONESUR NEGATIVE 09/22/2008 2350   PROTEINUR NEGATIVE  09/22/2008 2350   UROBILINOGEN 0.2 09/22/2008 2350   NITRITE NEGATIVE 09/22/2008 2350   LEUKOCYTESUR  09/22/2008 2350    NEGATIVE MICROSCOPIC NOT DONE ON URINES WITH NEGATIVE PROTEIN, BLOOD, LEUKOCYTES, NITRITE, OR GLUCOSE <1000 mg/dL.    Assessment & Plan:    Today, we discussed what the vas deferens is, where it is located, and its function. We reviewed the procedure for vasectomy, it's risks, benefits, alternatives, and likelihood of achieving his goals. We discussed in detail the procedure, complications, and recovery as well as the need for clearance prior to unprotected intercourse. We discussed that vasectomy does not protect against sexually transmitted diseases. We discussed that this procedure does not result in immediate sterility and that they would need to use other forms of birth control until he has been cleared with negative postvasectomy semen analyses. I explained that the procedure is considered to be permanent and that attempts at reversal have varying degrees of success. These options include vasectomy reversal, sperm retrieval, and in vitro fertilization; these can be very expensive. We discussed the chance of postvasectomy pain syndrome which occurs in less than 5% of patients. I explained to the patient that there is no treatment to resolve this chronic pain, and that if it developed I would not be able to help resolve the issue, but that surgery is generally not needed for correction. I explained there have even been reports of systemic like illness associated with this chronic pain, and that there was no good cure. I explained that vasectomy it is not a 100% reliable form of birth control, and the risk of pregnancy after vasectomy is approximately 1 in 2000 men who had a negative postvasectomy semen analysis or rare non-motile sperm. I explained that repeat vasectomy was necessary in less than 1% of vasectomy procedures when employing the type of technique that I use. I explained  that he should refrain from ejaculation for approximately one week following vasectomy. I explained that there are other options for birth control which are permanent and non-permanent; we discussed these. I explained the rates of surgical complications, such as symptomatic hematoma or infection, are low (1-2%) and vary with the surgeon's experience and criteria used to diagnose the complication.  The patient had the opportunity to ask questions to his stated satisfaction. He voiced understanding of the above factors and stated that he has read all the information provided to him and the packets and informed consent  1. Family planning -vasectomy  Return for vasectomy.  Hildred Laser, MD  [facility] [address] [place], [place] [address] [phone]

## 2016-09-29 ENCOUNTER — Ambulatory Visit (INDEPENDENT_AMBULATORY_CARE_PROVIDER_SITE_OTHER): Payer: Commercial Managed Care - HMO | Admitting: Urology

## 2016-09-29 ENCOUNTER — Encounter: Payer: Self-pay | Admitting: Urology

## 2016-09-29 VITALS — BP 129/76 | HR 118 | Ht 69.75 in | Wt 196.8 lb

## 2016-09-29 DIAGNOSIS — Z3009 Encounter for other general counseling and advice on contraception: Secondary | ICD-10-CM

## 2016-09-29 MED ORDER — OXYCODONE-ACETAMINOPHEN 5-325 MG PO TABS: 1.0000 | ORAL_TABLET | ORAL | 0 refills | Status: DC | PRN

## 2016-09-29 NOTE — Progress Notes (Signed)
Bilateral Vasectomy Procedure  Pre-Procedure: - Patient's scrotum was prepped and draped for vasectomy. - The vas was palpated through the scrotal skin on the left. - 1% Xylocaine was injected into the skin and surrounding tissue for placement  - In a similar manner, the vas on the right was identified, anesthetized, and stabilized.  Procedure: - A scalpel was used to make a 1 cm incision in his left hemiscrotum - The left vas was isolated and brought up through the incision exposing that structure. - Bleeding points were cauterized as they occurred. - The vas was free from the surrounding structures and brought into view. - A segment was positioned for placement with a hemostat. - A second hemostat was placed and a small segment between the two hemostats and was removed for inspection. - Each end of the transected vas lumen was fulgurated/obliterated using needlepoint electrocautery. It was then tied off with a #2-0 silk suture -The same procedure was performed on the right. - A suture of #3-0 chromic catgut was used to close each lateral scrotal skin incision  Post-Procedure: - Patient was instructed in care of the operative area - A specimen is to be delivered in 12 and 16 weeks   -Another form of contraception is to be used until he is cleared by the office.   

## 2016-10-05 ENCOUNTER — Encounter: Payer: Self-pay | Admitting: Urology

## 2016-10-05 ENCOUNTER — Ambulatory Visit (INDEPENDENT_AMBULATORY_CARE_PROVIDER_SITE_OTHER): Payer: Commercial Managed Care - HMO | Admitting: Urology

## 2016-10-05 VITALS — BP 135/83 | HR 66 | Ht 70.0 in | Wt 203.5 lb

## 2016-10-05 DIAGNOSIS — Z9852 Vasectomy status: Secondary | ICD-10-CM

## 2016-10-05 NOTE — Progress Notes (Signed)
10/05/2016 2:29 PM   [name] [name] September 13, 1985 [id number]  Referring provider: No referring provider defined for this encounter.  Chief Complaint  Patient presents with  . Post-op Problem    pain from vasectomy    HPI: Patient is a 31 yo WM who presents today requesting an urgent appointment for scrotal pain.  He underwent a vasectomy on 09/29/2016 with Dr. Sherryl Barters.  He did have slight pain after the vasectomy.  He iced the scrotum and took ibuprofen.  The pain continued to worsen.  He noticed increased in swelling.  He states now the right side is bothersome and swollen.  5-6/10.  Walking and getting up from a sitting position makes it worse.  Ice helps the pain.  He is taking ibuprofen every six hours.    There has been no drainage from the scrotum.  He has not had fevers, chills, nausea or vomiting.  He has not had injury to the area since the vasectomy.   He did ejaculate yesterday.  It was not painful or bloody.    He has not had any difficulty with urination.     PMH: Past Medical History:  Diagnosis Date  . Acid reflux   . Anxiety     Surgical History: Past Surgical History:  Procedure Laterality Date  . VASECTOMY      Home Medications:  Allergies as of 10/05/2016      Reactions   Erythromycin Rash      Medication List       Accurate as of 10/05/16  2:29 PM. Always use your most recent med list.          esomeprazole 20 MG capsule Commonly known as:  NEXIUM Take 20 mg by mouth daily at 12 noon.   ibuprofen 200 MG tablet Commonly known as:  ADVIL,MOTRIN Take 200 mg by mouth every 6 (six) hours as needed.   oxyCODONE-acetaminophen 5-325 MG tablet Commonly known as:  ROXICET Take 1-2 tablets by mouth every 4 (four) hours as needed for severe pain.   ZYRTEC-D PO Take 1 tablet by mouth.       Allergies:  Allergies  Allergen Reactions  . Erythromycin Rash    Family History: Family History  Problem Relation Age of Onset  . Prostate  cancer Neg Hx   . Kidney cancer Neg Hx   . Bladder Cancer Neg Hx     Social History:  reports that he has never smoked. He has never used smokeless tobacco. He reports that he drinks alcohol. He reports that he does not use drugs.  ROS: UROLOGY Frequent Urination?: No Hard to postpone urination?: No Burning/pain with urination?: No Get up at night to urinate?: No Leakage of urine?: No Urine stream starts and stops?: No Trouble starting stream?: No Do you have to strain to urinate?: No Blood in urine?: No Urinary tract infection?: No Sexually transmitted disease?: No Injury to kidneys or bladder?: No Painful intercourse?: No Weak stream?: No Erection problems?: No Penile pain?: No  Gastrointestinal Nausea?: No Vomiting?: No Indigestion/heartburn?: No Diarrhea?: No Constipation?: No  Constitutional Fever: No Night sweats?: No Weight loss?: No Fatigue?: No  Skin Skin rash/lesions?: No Itching?: No  Eyes Blurred vision?: No Double vision?: No  Ears/Nose/Throat Sore throat?: No Sinus problems?: No  Hematologic/Lymphatic Swollen glands?: No Easy bruising?: No  Cardiovascular Leg swelling?: No Chest pain?: No  Respiratory Cough?: No Shortness of breath?: No  Endocrine Excessive thirst?: No  Musculoskeletal Back pain?: No Joint pain?:  No  Neurological Headaches?: No Dizziness?: No  Psychologic Depression?: No Anxiety?: No  Physical Exam: BP 135/83   Pulse 66   Ht  (1.778 m)   Wt 203 lb 8 oz (92.3 kg)   BMI 29.20 kg/m   Constitutional: Well nourished. Alert and oriented, No acute distress. HEENT: [place] AT, moist mucus membranes. Trachea midline, no masses. Cardiovascular: No clubbing, cyanosis, or edema. Respiratory: Normal respiratory effort, no increased work of breathing. GI: Abdomen is soft, non tender, non distended, no abdominal masses. Liver and spleen not palpable.  No hernias appreciated.  Stool sample for occult testing is  not indicated.   GU: No CVA tenderness.  No bladder fullness or masses.  Patient with circumcised phallus.  Urethral meatus is patent.  No penile discharge. No penile lesions or rashes. Scrotum without lesions, cysts, rashes and/or edema.  Testicles are located scrotally bilaterally. No masses are appreciated in the testicles. Left and right epididymis are normal.  Vasectomy incisions are clean and dry.  Slight bruising is noted in the upper right scrotum.  Right spermatic cord was slightly tender with some induration.  No fluctuance is noted.   Rectal: Deferred.   Skin: No rashes, bruises or suspicious lesions. Lymph: No cervical or inguinal adenopathy. Neurologic: Grossly intact, no focal deficits, moving all 4 extremities. Psychiatric: Normal mood and affect.  Laboratory Data: Lab Results  Component Value Date   WBC 7.9 05/23/2009   HGB 15.7 05/23/2009   HCT 45.5 05/23/2009   MCV 92.2 05/23/2009   PLT 188 05/23/2009    Lab Results  Component Value Date   CREATININE 1.09 05/23/2009     Lab Results  Component Value Date   AST 23 05/23/2009   Lab Results  Component Value Date   ALT 18 05/23/2009     Assessment & Plan:    1. S/P vasectomy  - patient is reassured that his exam and post operative course is as expected  - he is to continue to use ice and take ibuprofen and continue light duty at work for the next few days  - follow up prn    Return if symptoms worsen or fail to improve.  These notes generated with voice recognition software. I apologize for typographical errors.  Michiel Cowboy, PA-C  [facility] [address] [place], [place] [address] [phone]

## 2017-01-02 ENCOUNTER — Other Ambulatory Visit: Payer: 59

## 2017-01-02 DIAGNOSIS — Z9852 Vasectomy status: Secondary | ICD-10-CM

## 2017-01-12 LAB — POST-VAS SPERM EVALUATION,QUAL

## 2017-01-16 ENCOUNTER — Telehealth: Payer: Self-pay | Admitting: Urology

## 2017-01-16 NOTE — Telephone Encounter (Signed)
Spoke with the patient and he will not be able to come in until tomorrow 01-17-17. His pain is mild and as far as his results his test got cancelled due to the sample being old? I have Lynda with [facility] looking into this and I will let the patient know something as soon as I find out something.  Marcelino DusterMichelle

## 2017-01-16 NOTE — Telephone Encounter (Signed)
Pt is having pain post vasectomy done on 3/29.  He also wants to know if results from post semen drop off are back.

## 2017-01-17 ENCOUNTER — Ambulatory Visit: Payer: 59 | Admitting: Urology

## 2017-01-19 ENCOUNTER — Ambulatory Visit (INDEPENDENT_AMBULATORY_CARE_PROVIDER_SITE_OTHER): Payer: 59 | Admitting: Urology

## 2017-01-19 ENCOUNTER — Encounter: Payer: Self-pay | Admitting: Urology

## 2017-01-19 VITALS — BP 122/76 | HR 103 | Ht 70.0 in | Wt 203.9 lb

## 2017-01-19 DIAGNOSIS — N5082 Scrotal pain: Secondary | ICD-10-CM | POA: Diagnosis not present

## 2017-01-19 DIAGNOSIS — Z9852 Vasectomy status: Secondary | ICD-10-CM | POA: Diagnosis not present

## 2017-01-19 NOTE — Progress Notes (Signed)
01/19/2017 3:29 PM   [name] GoltzJohn C Heather 1985-09-12 [id number]  Referring provider: No referring provider defined for this encounter.  Chief Complaint  Patient presents with  . Follow-up    HPI: The patient is a 31 year old gentleman presents today for scrotal discomfort. He underwent a vasectomy on 09/29/2016.  He reports that approximately 5 days ago he started feeling discomfort in his right testicle. Stenosis In the morning after ejaculating. He was very tender to the touch. The maxilla pain was on his left side. He is concerned that something went wrong from his previous procedure. It was hurting to walk a few days ago. For now the pain is mostly gone away. No burning. This is never happened before. No swelling.   PMH: Past Medical History:  Diagnosis Date  . Acid reflux   . Anxiety     Surgical History: Past Surgical History:  Procedure Laterality Date  . VASECTOMY      Home Medications:  Allergies as of 01/19/2017      Reactions   Erythromycin Rash      Medication List       Accurate as of 01/19/17  3:29 PM. Always use your most recent med list.          esomeprazole 20 MG capsule Commonly known as:  NEXIUM Take 20 mg by mouth daily at 12 noon.   ibuprofen 200 MG tablet Commonly known as:  ADVIL,MOTRIN Take 200 mg by mouth every 6 (six) hours as needed.   oxyCODONE-acetaminophen 5-325 MG tablet Commonly known as:  ROXICET Take 1-2 tablets by mouth every 4 (four) hours as needed for severe pain.   ZYRTEC-D PO Take 1 tablet by mouth.       Allergies:  Allergies  Allergen Reactions  . Erythromycin Rash    Family History: Family History  Problem Relation Age of Onset  . Prostate cancer Neg Hx   . Kidney cancer Neg Hx   . Bladder Cancer Neg Hx     Social History:  reports that he has never smoked. He has never used smokeless tobacco. He reports that he drinks alcohol. He reports that he does not use drugs.  ROS: UROLOGY Frequent  Urination?: No Hard to postpone urination?: No Burning/pain with urination?: No Get up at night to urinate?: No Leakage of urine?: No Urine stream starts and stops?: No Trouble starting stream?: No Do you have to strain to urinate?: No Blood in urine?: No Urinary tract infection?: No Sexually transmitted disease?: No Injury to kidneys or bladder?: No Painful intercourse?: No Weak stream?: No Erection problems?: No Penile pain?: No  Gastrointestinal Nausea?: No Vomiting?: No Indigestion/heartburn?: No Diarrhea?: No Constipation?: No  Constitutional Fever: No Night sweats?: No Weight loss?: No Fatigue?: No  Skin Skin rash/lesions?: No Itching?: No  Eyes Blurred vision?: No Double vision?: No  Ears/Nose/Throat Sore throat?: No Sinus problems?: No  Hematologic/Lymphatic Swollen glands?: No Easy bruising?: No  Cardiovascular Leg swelling?: No Chest pain?: No  Respiratory Cough?: No Shortness of breath?: No  Endocrine Excessive thirst?: No  Musculoskeletal Back pain?: No Joint pain?: No  Neurological Headaches?: No Dizziness?: No  Psychologic Depression?: No Anxiety?: No  Physical Exam: BP 122/76 (BP Location: Left Arm, Patient Position: Sitting, Cuff Size: Normal)   Pulse (!) 103   Ht 5\' 10"  (1.778 m)   Wt 203 lb 14.4 oz (92.5 kg)   BMI 29.26 kg/m   Constitutional:  Alert and oriented, No acute distress. HEENT: [place] AT, moist mucus  membranes.  Trachea midline, no masses. Cardiovascular: No clubbing, cyanosis, or edema. Respiratory: Normal respiratory effort, no increased work of breathing. GI: Abdomen is soft, nontender, nondistended, no abdominal masses GU: No CVA tenderness. Normal phallus. Testicles descended equally bilaterally. Normal scrotum. A infection. Testicles descended bilaterally with no sign of infection. No nodules noted. No cysts within the spermatic cord. Skin: No rashes, bruises or suspicious lesions. Lymph: No cervical or  inguinal adenopathy. Neurologic: Grossly intact, no focal deficits, moving all 4 extremities. Psychiatric: Normal mood and affect.  Laboratory Data: Lab Results  Component Value Date   WBC 7.9 05/23/2009   HGB 15.7 05/23/2009   HCT 45.5 05/23/2009   MCV 92.2 05/23/2009   PLT 188 05/23/2009    Lab Results  Component Value Date   CREATININE 1.09 05/23/2009    No results found for: PSA  No results found for: TESTOSTERONE  No results found for: HGBA1C  Urinalysis    Component Value Date/Time   COLORURINE YELLOW 09/22/2008 2350   APPEARANCEUR CLEAR 09/22/2008 2350   LABSPEC 1.022 09/22/2008 2350   PHURINE 6.5 09/22/2008 2350   GLUCOSEU NEGATIVE 09/22/2008 2350   HGBUR NEGATIVE 09/22/2008 2350   BILIRUBINUR NEGATIVE 09/22/2008 2350   KETONESUR NEGATIVE 09/22/2008 2350   PROTEINUR NEGATIVE 09/22/2008 2350   UROBILINOGEN 0.2 09/22/2008 2350   NITRITE NEGATIVE 09/22/2008 2350   LEUKOCYTESUR  09/22/2008 2350    NEGATIVE MICROSCOPIC NOT DONE ON URINES WITH NEGATIVE PROTEIN, BLOOD, LEUKOCYTES, NITRITE, OR GLUCOSE <1000 mg/dL.    Assessment & Plan:    1. Scrotal pain I reassured the patient that there is nothing abnormal on his exam. Since his pain is resolving, I do not think he needs further intervention. I have recommended one week scheduled NSAID therapy. He did drop off his first semen analysis today. He'll drop off another one off in 3-4 weeks. He'll follow-up for an appointment as needed.   Return if symptoms worsen or fail to improve.  Hildred Laser, MD  [facility] [address] [place], [place] [address] [phone]

## 2017-01-21 LAB — POST-VAS SPERM EVALUATION,QUAL: SEMEN VOLUME: 3.1 mL

## 2017-01-23 ENCOUNTER — Telehealth: Payer: Self-pay | Admitting: Family Medicine

## 2017-01-23 NOTE — Telephone Encounter (Signed)
Patient notified

## 2017-01-23 NOTE — Telephone Encounter (Signed)
-----   Message from Hildred LaserBrian James Budzyn, MD sent at 01/23/2017  9:01 AM EDT ----- Please let patient know there were no sperm in semen analysis. Follow up in 4 weeks with one more semen analysis to confirm. Thanks    ----- Message ----- From: Ellin GoodieLowe, Casandra S, CMA Sent: 01/23/2017   8:13 AM To: Hildred LaserBrian James Budzyn, MD    ----- Message ----- From: Interface, [facility] Lab Results In Sent: 01/21/2017   5:40 AM To: Jennette KettleBua Clinical

## 2017-04-25 ENCOUNTER — Ambulatory Visit (INDEPENDENT_AMBULATORY_CARE_PROVIDER_SITE_OTHER): Payer: 59

## 2017-04-25 ENCOUNTER — Ambulatory Visit (INDEPENDENT_AMBULATORY_CARE_PROVIDER_SITE_OTHER): Payer: 59 | Admitting: Emergency Medicine

## 2017-04-25 VITALS — BP 108/70 | HR 98 | Temp 98.4°F | Resp 16 | Ht 70.0 in | Wt 204.5 lb

## 2017-04-25 DIAGNOSIS — R05 Cough: Secondary | ICD-10-CM | POA: Diagnosis not present

## 2017-04-25 DIAGNOSIS — J3489 Other specified disorders of nose and nasal sinuses: Secondary | ICD-10-CM

## 2017-04-25 DIAGNOSIS — J0100 Acute maxillary sinusitis, unspecified: Secondary | ICD-10-CM

## 2017-04-25 MED ORDER — PREDNISONE 20 MG PO TABS: 40.0000 mg | ORAL_TABLET | Freq: Every day | ORAL | 0 refills | Status: AC

## 2017-04-25 MED ORDER — AMOXICILLIN-POT CLAVULANATE 875-125 MG PO TABS: 1.0000 | ORAL_TABLET | Freq: Two times a day (BID) | ORAL | 0 refills | Status: AC

## 2017-04-25 NOTE — Progress Notes (Signed)
[name] [name] 31 y.o.   Chief Complaint  Patient presents with  . Dizziness    with SOB x 2-3 weeks  . Nausea    HISTORY OF PRESENT ILLNESS: This is a 31 y.o. male complaining of dizziness, nausea, chills.  Sinus Problem  This is a new problem. The current episode started 1 to 4 weeks ago. The problem has been gradually worsening since onset. The maximum temperature recorded prior to his arrival was 100.4 - 100.9 F. His pain is at a severity of 3/10. The pain is mild. Associated symptoms include chills, congestion, coughing and sinus pressure. Pertinent negatives include no headaches, neck pain, shortness of breath or sore throat. (Nausea, dizziness)     Prior to Admission medications   Medication Sig Start Date End Date Taking? Authorizing Provider  Cetirizine-Pseudoephedrine (ZYRTEC-D PO) Take 1 tablet by mouth.   Yes [provider]  esomeprazole (NEXIUM) 20 MG capsule Take 20 mg by mouth daily at 12 noon.   Yes [provider]  ibuprofen (ADVIL,MOTRIN) 200 MG tablet Take 200 mg by mouth every 6 (six) hours as needed.    [provider]  oxyCODONE-acetaminophen (ROXICET) 5-325 MG tablet Take 1-2 tablets by mouth every 4 (four) hours as needed for severe pain. Patient not taking: Reported on 10/05/2016 09/29/16   Hildred Laser, MD    Allergies  Allergen Reactions  . Erythromycin Rash    There are no active problems to display for this patient.   Past Medical History:  Diagnosis Date  . Acid reflux   . Anxiety     Past Surgical History:  Procedure Laterality Date  . VASECTOMY      Social History   Social History  . Marital status: Single    Spouse name: N/A  . Number of children: N/A  . Years of education: N/A   Occupational History  . Not on file.   Social History Main Topics  . Smoking status: Never Smoker  . Smokeless tobacco: Never Used  . Alcohol use Yes     Comment: ocassional  . Drug use: No  . Sexual activity: Not  on file   Other Topics Concern  . Not on file   Social History Narrative  . No narrative on file    Family History  Problem Relation Age of Onset  . Prostate cancer Neg Hx   . Kidney cancer Neg Hx   . Bladder Cancer Neg Hx      Review of Systems  Constitutional: Positive for chills, fever and malaise/fatigue.  HENT: Positive for congestion and sinus pressure. Negative for nosebleeds and sore throat.   Eyes: Negative.  Negative for discharge.  Respiratory: Positive for cough. Negative for shortness of breath.   Cardiovascular: Negative for chest pain, palpitations, claudication and leg swelling.  Gastrointestinal: Positive for nausea. Negative for abdominal pain, diarrhea and vomiting.  Genitourinary: Negative.  Negative for dysuria and hematuria.  Musculoskeletal: Negative for back pain, myalgias and neck pain.  Neurological: Positive for dizziness. Negative for sensory change, focal weakness and headaches.  Endo/Heme/Allergies: Negative.   All other systems reviewed and are negative.    Vitals:   04/25/17 1533  BP: 108/70  Pulse: 98  Resp: 16  Temp: 98.4 F (36.9 C)  SpO2: 99%     Physical Exam  Constitutional: He is oriented to person, place, and time. He appears well-developed and well-nourished.  HENT:  Head: Normocephalic and atraumatic.  Right Ear: External ear normal.  Left  Ear: External ear normal.  Nose: Nose normal.  Mouth/Throat: Oropharynx is clear and moist.  Eyes: Pupils are equal, round, and reactive to light. Conjunctivae and EOM are normal.  Neck: Normal range of motion. Neck supple. No JVD present.  Cardiovascular: Normal rate, regular rhythm, normal heart sounds and intact distal pulses.   Pulmonary/Chest: Effort normal and breath sounds normal.  Abdominal: Soft. Bowel sounds are normal. He exhibits no distension. There is no tenderness.  Musculoskeletal: Normal range of motion.  Lymphadenopathy:    He has no cervical adenopathy.    Neurological: He is alert and oriented to person, place, and time. No sensory deficit. He exhibits normal muscle tone.  Skin: Skin is warm and dry. Capillary refill takes less than 2 seconds. No rash noted.  Psychiatric: He has a normal mood and affect. His behavior is normal.  Vitals reviewed.    ASSESSMENT & PLAN: [name] [name] was seen today for dizziness and nausea.  Diagnoses and all orders for this visit:  Acute non-recurrent maxillary sinusitis -     CBC with Differential/Platelet -     Comprehensive metabolic panel -     DG Chest 2 View; Future  Sinus pressure -     CBC with Differential/Platelet -     Comprehensive metabolic panel -     DG Chest 2 View; Future  Other orders -     amoxicillin-clavulanate (AUGMENTIN) 875-125 MG tablet; Take 1 tablet by mouth 2 (two) times daily. -     predniSONE (DELTASONE) 20 MG tablet; Take 2 tablets (40 mg total) by mouth daily with breakfast.    Patient Instructions       IF you received an x-ray today, you will receive an invoice from [facility]. Please contact [facility] at [phone] with questions or concerns regarding your invoice.   IF you received labwork today, you will receive an invoice from [facility]. Please contact [facility] at [phone] with questions or concerns regarding your invoice.   Our billing staff will not be able to assist you with questions regarding bills from these companies.  You will be contacted with the lab results as soon as they are available. The fastest way to get your results is to activate your My Chart account. Instructions are located on the last page of this paperwork. If you have not heard from [place] regarding the results in 2 weeks, please contact this office.     Sinusitis, Adult Sinusitis is soreness and inflammation of your sinuses. Sinuses are hollow spaces in the bones around your face. They are located:  Around your eyes.  In the middle of your forehead.  Behind  your nose.  In your cheekbones.  Your sinuses and nasal passages are lined with a stringy fluid (mucus). Mucus normally drains out of your sinuses. When your nasal tissues get inflamed or swollen, the mucus can get trapped or blocked so air cannot flow through your sinuses. This lets bacteria, viruses, and funguses grow, and that leads to infection. Follow these instructions at home: Medicines  Take, use, or apply over-the-counter and prescription medicines only as told by your doctor. These may include nasal sprays.  If you were prescribed an antibiotic medicine, take it as told by your doctor. Do not stop taking the antibiotic even if you start to feel better. Hydrate and Humidify  Drink enough water to keep your pee (urine) clear or pale yellow.  Use a cool mist humidifier to keep the humidity level in your home above 50%.  Breathe in steam for 10-15 minutes, 3-4 times a day or as told by your doctor. You can do this in the bathroom while a hot shower is running.  Try not to spend time in cool or dry air. Rest  Rest as much as possible.  Sleep with your head raised (elevated).  Make sure to get enough sleep each night. General instructions  Put a warm, moist washcloth on your face 3-4 times a day or as told by your doctor. This will help with discomfort.  Wash your hands often with soap and water. If there is no soap and water, use hand sanitizer.  Do not smoke. Avoid being around people who are smoking (secondhand smoke).  Keep all follow-up visits as told by your doctor. This is important. Contact a doctor if:  You have a fever.  Your symptoms get worse.  Your symptoms do not get better within 10 days. Get help right away if:  You have a very bad headache.  You cannot stop throwing up (vomiting).  You have pain or swelling around your face or eyes.  You have trouble seeing.  You feel confused.  Your neck is stiff.  You have trouble breathing. This  information is not intended to replace advice given to you by your health care provider. Make sure you discuss any questions you have with your health care provider. Document Released: 12/07/2007 Document Revised: 02/14/2016 Document Reviewed: 04/15/2015 Elsevier Interactive Patient Education  2018 Elsevier Inc.     Edwina BarthMiguel Ronith Berti, MD [facility]

## 2017-04-25 NOTE — Patient Instructions (Addendum)
     IF you received an x-ray today, you will receive an invoice from [facility]. Please contact [facility] at [phone] with questions or concerns regarding your invoice.   IF you received labwork today, you will receive an invoice from [facility]. Please contact [facility] at [phone] with questions or concerns regarding your invoice.   Our billing staff will not be able to assist you with questions regarding bills from these companies.  You will be contacted with the lab results as soon as they are available. The fastest way to get your results is to activate your My Chart account. Instructions are located on the last page of this paperwork. If you have not heard from us regarding the results in 2 weeks, please contact this office.     Sinusitis, Adult Sinusitis is soreness and inflammation of your sinuses. Sinuses are hollow spaces in the bones around your face. They are located:  Around your eyes.  In the middle of your forehead.  Behind your nose.  In your cheekbones.  Your sinuses and nasal passages are lined with a stringy fluid (mucus). Mucus normally drains out of your sinuses. When your nasal tissues get inflamed or swollen, the mucus can get trapped or blocked so air cannot flow through your sinuses. This lets bacteria, viruses, and funguses grow, and that leads to infection. Follow these instructions at home: Medicines  Take, use, or apply over-the-counter and prescription medicines only as told by your doctor. These may include nasal sprays.  If you were prescribed an antibiotic medicine, take it as told by your doctor. Do not stop taking the antibiotic even if you start to feel better. Hydrate and Humidify  Drink enough water to keep your pee (urine) clear or pale yellow.  Use a cool mist humidifier to keep the humidity level in your home above 50%.  Breathe in steam for 10-15 minutes, 3-4 times a day or as told by your doctor. You can do  this in the bathroom while a hot shower is running.  Try not to spend time in cool or dry air. Rest  Rest as much as possible.  Sleep with your head raised (elevated).  Make sure to get enough sleep each night. General instructions  Put a warm, moist washcloth on your face 3-4 times a day or as told by your doctor. This will help with discomfort.  Wash your hands often with soap and water. If there is no soap and water, use hand sanitizer.  Do not smoke. Avoid being around people who are smoking (secondhand smoke).  Keep all follow-up visits as told by your doctor. This is important. Contact a doctor if:  You have a fever.  Your symptoms get worse.  Your symptoms do not get better within 10 days. Get help right away if:  You have a very bad headache.  You cannot stop throwing up (vomiting).  You have pain or swelling around your face or eyes.  You have trouble seeing.  You feel confused.  Your neck is stiff.  You have trouble breathing. This information is not intended to replace advice given to you by your health care provider. Make sure you discuss any questions you have with your health care provider. Document Released: 12/07/2007 Document Revised: 02/14/2016 Document Reviewed: 04/15/2015 Elsevier Interactive Patient Education  2018 Elsevier Inc.  

## 2017-04-26 ENCOUNTER — Encounter: Payer: Self-pay | Admitting: Radiology

## 2017-04-26 DIAGNOSIS — J309 Allergic rhinitis, unspecified: Secondary | ICD-10-CM | POA: Diagnosis not present

## 2017-04-26 DIAGNOSIS — R42 Dizziness and giddiness: Secondary | ICD-10-CM | POA: Diagnosis not present

## 2017-04-26 LAB — CBC WITH DIFFERENTIAL/PLATELET
Basophils Absolute: 0 10*3/uL (ref 0.0–0.2)
Basos: 0 %
EOS (ABSOLUTE): 0.1 10*3/uL (ref 0.0–0.4)
EOS: 2 %
Hematocrit: 42.7 % (ref 37.5–51.0)
Hemoglobin: 15.3 g/dL (ref 13.0–17.7)
IMMATURE GRANS (ABS): 0 10*3/uL (ref 0.0–0.1)
Immature Granulocytes: 0 %
LYMPHS: 28 %
Lymphocytes Absolute: 1.9 10*3/uL (ref 0.7–3.1)
MCH: 30.9 pg (ref 26.6–33.0)
MCHC: 35.8 g/dL — AB (ref 31.5–35.7)
MCV: 86 fL (ref 79–97)
Monocytes Absolute: 0.6 10*3/uL (ref 0.1–0.9)
Monocytes: 8 %
NEUTROS PCT: 62 %
Neutrophils Absolute: 4.3 10*3/uL (ref 1.4–7.0)
PLATELETS: 214 10*3/uL (ref 150–379)
RBC: 4.95 x10E6/uL (ref 4.14–5.80)
RDW: 13.6 % (ref 12.3–15.4)
WBC: 6.9 10*3/uL (ref 3.4–10.8)

## 2017-04-26 LAB — COMPREHENSIVE METABOLIC PANEL
A/G RATIO: 2 (ref 1.2–2.2)
ALT: 27 IU/L (ref 0–44)
AST: 20 IU/L (ref 0–40)
Albumin: 4.7 g/dL (ref 3.5–5.5)
Alkaline Phosphatase: 77 IU/L (ref 39–117)
BILIRUBIN TOTAL: 0.2 mg/dL (ref 0.0–1.2)
BUN/Creatinine Ratio: 17 (ref 9–20)
BUN: 17 mg/dL (ref 6–20)
CHLORIDE: 102 mmol/L (ref 96–106)
CO2: 23 mmol/L (ref 20–29)
Calcium: 9.7 mg/dL (ref 8.7–10.2)
Creatinine, Ser: 0.98 mg/dL (ref 0.76–1.27)
GFR calc non Af Amer: 104 mL/min/{1.73_m2} (ref >59)
GFR, EST AFRICAN AMERICAN: 120 mL/min/{1.73_m2} (ref >59)
Globulin, Total: 2.3 g/dL (ref 1.5–4.5)
Glucose: 94 mg/dL (ref 65–99)
Potassium: 3.8 mmol/L (ref 3.5–5.2)
Sodium: 145 mmol/L — ABNORMAL HIGH (ref 134–144)
TOTAL PROTEIN: 7 g/dL (ref 6.0–8.5)

## 2017-04-27 ENCOUNTER — Telehealth: Payer: Self-pay | Admitting: Emergency Medicine

## 2017-04-27 NOTE — Telephone Encounter (Signed)
PATIENT WAS IN TO SEE DR. SAGARDIA ON TUES. (04/25/17) AND HE DID SOME LAB WORK ON HIM. HE WOULD LIKE TO GET THE RESULTS BECAUSE HE DOES NOT HAVE MY-CHART. BEST PHONE [phone] (CELL) PHARMACY CHOICE IF NEEDED IS [facility] IN [place]. MBC

## 2017-05-01 NOTE — Telephone Encounter (Signed)
Pt advised.

## 2017-05-04 ENCOUNTER — Telehealth: Payer: Self-pay | Admitting: Emergency Medicine

## 2017-05-04 DIAGNOSIS — R42 Dizziness and giddiness: Secondary | ICD-10-CM | POA: Diagnosis not present

## 2017-05-04 DIAGNOSIS — J309 Allergic rhinitis, unspecified: Secondary | ICD-10-CM | POA: Diagnosis not present

## 2017-05-04 DIAGNOSIS — R51 Headache: Secondary | ICD-10-CM | POA: Diagnosis not present

## 2017-05-04 NOTE — Telephone Encounter (Signed)
Pt has questions regarding his lab results he got in the mail  Best number [phone]

## 2017-05-23 DIAGNOSIS — J301 Allergic rhinitis due to pollen: Secondary | ICD-10-CM | POA: Diagnosis not present

## 2017-06-01 ENCOUNTER — Ambulatory Visit (INDEPENDENT_AMBULATORY_CARE_PROVIDER_SITE_OTHER): Payer: 59 | Admitting: Family Medicine

## 2017-06-01 ENCOUNTER — Encounter: Payer: Self-pay | Admitting: Family Medicine

## 2017-06-01 VITALS — BP 128/74 | HR 72 | Temp 98.1°F | Resp 16 | Ht 70.0 in | Wt 209.0 lb

## 2017-06-01 DIAGNOSIS — J302 Other seasonal allergic rhinitis: Secondary | ICD-10-CM | POA: Diagnosis not present

## 2017-06-01 DIAGNOSIS — K219 Gastro-esophageal reflux disease without esophagitis: Secondary | ICD-10-CM | POA: Diagnosis not present

## 2017-06-01 DIAGNOSIS — J3489 Other specified disorders of nose and nasal sinuses: Secondary | ICD-10-CM | POA: Diagnosis not present

## 2017-06-01 DIAGNOSIS — F419 Anxiety disorder, unspecified: Secondary | ICD-10-CM | POA: Diagnosis not present

## 2017-06-01 DIAGNOSIS — J309 Allergic rhinitis, unspecified: Secondary | ICD-10-CM | POA: Insufficient documentation

## 2017-06-01 DIAGNOSIS — J3089 Other allergic rhinitis: Secondary | ICD-10-CM | POA: Insufficient documentation

## 2017-06-01 NOTE — Progress Notes (Signed)
Patient: [name] [name] Male    DOB: 1986-05-27   31 y.o.   MRN: [id number] Visit Date: 06/01/2017  Today's Provider: [name], MD   Chief Complaint  Patient presents with  . New Patient (Initial Visit)   Subjective:    HPI Pt is here to establish care. He reports that he recently was seen at the [facility] urgent care in [place]. He was dizziness, and shortness of breath. They did a Met C and CBC, and sent him to ENT. He was also given Antibiotics and prednisone. He was unable to take these. It made him sick to his stomach and gave him palpitations. Saw ENT and was worked up for vertigo - negative.  CT sinus showed enlarged turbinates. This led to allergy testing.  Now that he feels better, he canceled f/u with ENT.  He reports that he has had anxiety in the past and had some left over Alprazolam, he called the pharmacist to see if they were still good because they were expired. He took one of them and he reports that his symptoms were better. He reports that for the last couple of years his anxiety has not given him a problem but with three kids he feels anxious again.   Stress within the last 5 months has worsened anxiety and it has become more frequent.  Denies depressive symptoms currently. States that he felt down for ~1 wk.  No SI/HI.  Was previously taking Xanax prn.  Tried a daily pill for a few months and this was stopped. Does not remember why this was stopped whether it was side effects or ineffective.  Allergies: saw an allergist a few weeks ago.  Tested positive for several different allergens.  Not interested in allergy shots at this time.  Takes Zyrtec D in spring and summer for 2-3 months and alternates with Zyrtec.  Stopped Zyrtec 1 month ago.  Also using Flonase.  Saw ENT recently as well.  Heart murmur: has been told by several different doctors that he has a heart murmur and that it is subtle.  GERD: well controlled on daily Nexium  Depression screen  [facility] 2/9 06/01/2017 04/25/2017  Decreased Interest 0 0  Down, Depressed, Hopeless 0 0  PHQ - 2 Score 0 0  Altered sleeping 1 -  Tired, decreased energy 1 -  Change in appetite 0 -  Feeling bad or failure about yourself  0 -  Trouble concentrating 0 -  Moving slowly or fidgety/restless 0 -  Suicidal thoughts 0 -  PHQ-9 Score 2 -  Difficult doing work/chores Not difficult at all -    GAD 7 : Generalized Anxiety Score 06/01/2017  Nervous, Anxious, on Edge 2  Control/stop worrying 1  Worry too much - different things 1  Trouble relaxing 1  Restless 1  Easily annoyed or irritable 1  Afraid - awful might happen 1  Total GAD 7 Score 8  Anxiety Difficulty Not difficult at all       Allergies  Allergen Reactions  . Erythromycin Rash     Current Outpatient Medications:  .  esomeprazole (NEXIUM) 20 MG capsule, Take 20 mg by mouth daily at 12 noon., Disp: , Rfl:  .  fluticasone (FLONASE) 50 MCG/ACT nasal spray, Place 2 sprays into both nostrils daily., Disp: , Rfl:  .  ibuprofen (ADVIL,MOTRIN) 200 MG tablet, Take 200 mg by mouth every 6 (six) hours as needed., Disp: , Rfl:   Review of Systems  Constitutional: Positive for fatigue.  HENT: Positive for congestion and tinnitus.   Eyes: Negative.   Respiratory: Negative.   Cardiovascular: Negative.   Gastrointestinal: Negative.   Endocrine: Negative.   Genitourinary: Negative.   Musculoskeletal: Negative.   Skin: Negative.   Allergic/Immunologic: Positive for environmental allergies.  Neurological: Positive for dizziness.  Hematological: Negative.   Psychiatric/Behavioral: Negative.    Past Medical History:  Diagnosis Date  . Acid reflux   . Allergy   . Anxiety   . Heart murmur    Past Surgical History:  Procedure Laterality Date  . VASECTOMY     Family History  Problem Relation Age of Onset  . Thyroid disease Mother   . Anxiety disorder Mother   . Diabetes Father        pre-diabetes  . Hypothyroidism Sister     . Cancer Paternal Grandmother        thinks it was liver and pancreatic  . Diabetes Paternal Grandfather   . Prostate cancer Neg Hx   . Kidney cancer Neg Hx   . Bladder Cancer Neg Hx   . Breast cancer Neg Hx   . Colon cancer Neg Hx     Social History   Tobacco Use  . Smoking status: Never Smoker  . Smokeless tobacco: Current User  Substance Use Topics  . Alcohol use: Yes    Alcohol/week: 0.6 - 2.4 oz    Types: 1 - 4 Cans of beer per week   Objective:   BP 128/74 (BP Location: Left Arm, Patient Position: Sitting, Cuff Size: Large)   Pulse 72   Temp 98.1 F (36.7 C) (Oral)   Resp 16   Ht '5\' 10"'$  (1.778 m)   Wt 209 lb (94.8 kg)   BMI 29.99 kg/m  Vitals:   06/01/17 1528  BP: 128/74  Pulse: 72  Resp: 16  Temp: 98.1 F (36.7 C)  TempSrc: Oral  Weight: 209 lb (94.8 kg)  Height: '5\' 10"'$  (1.778 m)     Physical Exam  Constitutional: He is oriented to person, place, and time. He appears well-developed and well-nourished. No distress.  HENT:  Head: Normocephalic and atraumatic.  Right Ear: External ear normal.  Left Ear: External ear normal.  Nose: Nose normal. Right sinus exhibits no maxillary sinus tenderness and no frontal sinus tenderness. Left sinus exhibits no maxillary sinus tenderness and no frontal sinus tenderness.  Mouth/Throat: Oropharynx is clear and moist.  Eyes: Conjunctivae are normal. Pupils are equal, round, and reactive to light. No scleral icterus.  Neck: Neck supple. No thyromegaly present.  Cardiovascular: Normal rate, regular rhythm, normal heart sounds and intact distal pulses.  No murmur heard. Pulmonary/Chest: Breath sounds normal. No respiratory distress. He has no wheezes. He has no rales.  Abdominal: Soft. Bowel sounds are normal. He exhibits no distension. There is no tenderness. There is no rebound and no guarding.  Musculoskeletal: He exhibits no edema or deformity.  Lymphadenopathy:    He has no cervical adenopathy.  Neurological: He is  alert and oriented to person, place, and time.  Skin: Skin is warm and dry. No rash noted.  Psychiatric: He has a normal mood and affect. His behavior is normal.  Vitals reviewed.       Assessment & Plan:      Problem List Items Addressed This Visit      Respiratory   Allergic rhinitis    Related to recent sinus troubles Flonase and zyrtec with avoidance of regular decongestants discussed Can  f/u prn with allergy and ENT        Digestive   GERD (gastroesophageal reflux disease)    Well controlled Continue Nexium Monitor Cr annually        Other   Sinus pressure - Primary    Improving Per recent workup with enlarged nasal turbinates, likely related to allergies Discussed avoidance of pseudofed regularly Take antihistamine and Flonase      Anxiety    Mild to moderate symptoms Not interested in medication at this time Discussed usefulness of therapy with possible CBT and meditation Patient states he is working on coping and stress reduction techniques Discussed that if meds were tried in the future, would consider SSRI over benzos           Return in about 1 year (around 06/01/2018) for Physical.     The entirety of the information documented in the History of Present Illness, Review of Systems and Physical Exam were personally obtained by me. Portions of this information were initially documented by [place], [place] and reviewed by me for thoroughness and accuracy.     [name], MD  [facility]

## 2017-06-01 NOTE — Patient Instructions (Signed)
Allergic Rhinitis Allergic rhinitis is when the mucous membranes in the nose respond to allergens. Allergens are particles in the air that cause your body to have an allergic reaction. This causes you to release allergic antibodies. Through a chain of events, these eventually cause you to release histamine into the blood stream. Although meant to protect the body, it is this release of histamine that causes your discomfort, such as frequent sneezing, congestion, and an itchy, runny nose. What are the causes? Seasonal allergic rhinitis (hay fever) is caused by pollen allergens that may come from grasses, trees, and weeds. Year-round allergic rhinitis (perennial allergic rhinitis) is caused by allergens such as house dust mites, pet dander, and mold spores. What are the signs or symptoms?  Nasal stuffiness (congestion).  Itchy, runny nose with sneezing and tearing of the eyes. How is this diagnosed? Your health care provider can help you determine the allergen or allergens that trigger your symptoms. If you and your health care provider are unable to determine the allergen, skin or blood testing may be used. Your health care provider will diagnose your condition after taking your health history and performing a physical exam. Your health care provider may assess you for other related conditions, such as asthma, pink eye, or an ear infection. How is this treated? Allergic rhinitis does not have a cure, but it can be controlled by:  Medicines that block allergy symptoms. These may include allergy shots, nasal sprays, and oral antihistamines.  Avoiding the allergen. Hay fever may often be treated with antihistamines in pill or nasal spray forms. Antihistamines block the effects of histamine. There are over-the-counter medicines that may help with nasal congestion and swelling around the eyes. Check with your health care provider before taking or giving this medicine. If avoiding the allergen or the  medicine prescribed do not work, there are many new medicines your health care provider can prescribe. Stronger medicine may be used if initial measures are ineffective. Desensitizing injections can be used if medicine and avoidance does not work. Desensitization is when a patient is given ongoing shots until the body becomes less sensitive to the allergen. Make sure you follow up with your health care provider if problems continue. Follow these instructions at home: It is not possible to completely avoid allergens, but you can reduce your symptoms by taking steps to limit your exposure to them. It helps to know exactly what you are allergic to so that you can avoid your specific triggers. Contact a health care provider if:  You have a fever.  You develop a cough that does not stop easily (persistent).  You have shortness of breath.  You start wheezing.  Symptoms interfere with normal daily activities. This information is not intended to replace advice given to you by your health care provider. Make sure you discuss any questions you have with your health care provider. Document Released: 03/15/2001 Document Revised: 02/19/2016 Document Reviewed: 02/25/2013 Elsevier Interactive Patient Education  2017 Elsevier Inc.  

## 2017-06-01 NOTE — Assessment & Plan Note (Signed)
Mild to moderate symptoms Not interested in medication at this time Discussed usefulness of therapy with possible CBT and meditation Patient states he is working on coping and stress reduction techniques Discussed that if meds were tried in the future, would consider SSRI over benzos

## 2017-06-01 NOTE — Assessment & Plan Note (Signed)
Well controlled Continue Nexium Monitor Cr annually

## 2017-06-01 NOTE — Assessment & Plan Note (Signed)
Improving Per recent workup with enlarged nasal turbinates, likely related to allergies Discussed avoidance of pseudofed regularly Take antihistamine and Flonase

## 2017-06-01 NOTE — Assessment & Plan Note (Signed)
Related to recent sinus troubles Flonase and zyrtec with avoidance of regular decongestants discussed Can f/u prn with allergy and ENT

## 2017-08-18 ENCOUNTER — Encounter: Payer: Self-pay | Admitting: Family Medicine

## 2017-08-18 ENCOUNTER — Ambulatory Visit: Payer: 59 | Admitting: Family Medicine

## 2017-08-18 VITALS — BP 132/80 | HR 84 | Temp 98.0°F | Resp 16 | Wt 205.0 lb

## 2017-08-18 DIAGNOSIS — F419 Anxiety disorder, unspecified: Secondary | ICD-10-CM

## 2017-08-18 DIAGNOSIS — K219 Gastro-esophageal reflux disease without esophagitis: Secondary | ICD-10-CM

## 2017-08-18 DIAGNOSIS — F410 Panic disorder [episodic paroxysmal anxiety] without agoraphobia: Secondary | ICD-10-CM | POA: Insufficient documentation

## 2017-08-18 DIAGNOSIS — J302 Other seasonal allergic rhinitis: Secondary | ICD-10-CM

## 2017-08-18 DIAGNOSIS — H6983 Other specified disorders of Eustachian tube, bilateral: Secondary | ICD-10-CM | POA: Diagnosis not present

## 2017-08-18 DIAGNOSIS — J3489 Other specified disorders of nose and nasal sinuses: Secondary | ICD-10-CM | POA: Diagnosis not present

## 2017-08-18 DIAGNOSIS — H6980 Other specified disorders of Eustachian tube, unspecified ear: Secondary | ICD-10-CM | POA: Insufficient documentation

## 2017-08-18 MED ORDER — OMEPRAZOLE 20 MG PO CPDR: 20.0000 mg | DELAYED_RELEASE_CAPSULE | Freq: Every day | ORAL | 11 refills | Status: DC

## 2017-08-18 MED ORDER — CITALOPRAM HYDROBROMIDE 20 MG PO TABS: 20.0000 mg | ORAL_TABLET | Freq: Every day | ORAL | 1 refills | Status: DC

## 2017-08-18 MED ORDER — TRIAMCINOLONE ACETONIDE 55 MCG/ACT NA AERO
2.0000 | INHALATION_SPRAY | Freq: Every day | NASAL | 12 refills | Status: DC
Start: 2017-08-18 — End: 2017-12-20

## 2017-08-18 MED ORDER — ALPRAZOLAM 1 MG PO TABS: 1.0000 mg | ORAL_TABLET | Freq: Every day | ORAL | 0 refills | Status: DC | PRN

## 2017-08-18 NOTE — Assessment & Plan Note (Signed)
Uncontrolled Agrees to try SSRI Will start Celexa 20mg  daily Discussed possible side effects including sexual dysfunction and GI upset and that it can take up to 6-8 weeks to reach full efficacy Discussed usefullness of therapy and possible CBT Ok to use Xanax sparingly for panic attacks (see below) F/u in 1 month, repeat GAD7, possible dose increase of Celexa

## 2017-08-18 NOTE — Assessment & Plan Note (Signed)
Likely related to allergies Treat as above Avoid regular use of decongestants

## 2017-08-18 NOTE — Assessment & Plan Note (Signed)
Related to allergic rhinitis Trial of nasacort as above

## 2017-08-18 NOTE — Assessment & Plan Note (Signed)
Advised avoiding regular decongestant use Will d/c flonase and try nasacort intead - may be reaction to alcohol base Could try singulair  In the future if needed

## 2017-08-18 NOTE — Assessment & Plan Note (Signed)
Control improving on Omeprazole Ok to continue Zantac prn Worsening could be related to anxiety If not well controlled with anxiety, consider GI referral for possible EGD

## 2017-08-18 NOTE — Progress Notes (Signed)
Patient: [name] [name] Male    DOB: 12/27/1985   32 y.o.   MRN: [id number] Visit Date: 08/18/2017  Today's Provider: Shirlee Latch, MD   Chief Complaint  Patient presents with  . Anxiety   Subjective:    Anxiety  Presents for follow-up (LOV was about 3 months ago; pt declines medication prescription at that time) visit. Symptoms include decreased concentration, dizziness, dry mouth, excessive worry, malaise, nausea, nervous/anxious behavior, palpitations, panic, restlessness and shortness of breath (with panic). Patient reports no chest pain, compulsions, confusion, depressed mood, feeling of choking, hyperventilation, insomnia, irritability, muscle tension, obsessions or suicidal ideas. Primary symptoms comment: globus sensation. The severity of symptoms is moderate (worsening). The quality of sleep is fair (states he falls asleep easily, but wakes up in the middle of the night and can not fall back asleep).   Compliance with medications: He was prescribed Xanax 1 mg, which he takes 1-2 times per week PRN.  He states he and his wife are moving, which is contributing to stress. He also has 3 small children.  Has tried Zoloft in the past around age 36.  He felt like he had anhedonia and lack of motivation when taking this medication.  He denies being depressed at that time.    Pt also states his GERD is worsening. Has changed Nexium to Prilosec 20 mg, with some relief. He states he has to take Zantac 150 mg PRN for breakthrough sx. Does chew tobacco and knows that this doesn't help the situation.  His mother and father also have bad GERD symptoms.  Worse at night or after eating spicy.  He has alos had nasal/sinus congestion, headaches, ear pressure for many months.  He was previously seen by ENT. On CT sinuses, had very enlarged nasal turbinates.  Used to take Zyrtec-D.  Unable to tolerate flonase due to drying of nasal passages.   GAD 7 : Generalized Anxiety Score 08/18/2017  06/01/2017  Nervous, Anxious, on Edge 3 2  Control/stop worrying 2 1  Worry too much - different things 2 1  Trouble relaxing 1 1  Restless 1 1  Easily annoyed or irritable 1 1  Afraid - awful might happen 1 1  Total GAD 7 Score 11 8  Anxiety Difficulty Not difficult at all Not difficult at all    Depression screen [facility] 2/9 08/18/2017 06/01/2017 04/25/2017  Decreased Interest 1 0 0  Down, Depressed, Hopeless 1 0 0  PHQ - 2 Score 2 0 0  Altered sleeping 2 1 -  Tired, decreased energy 2 1 -  Change in appetite 1 0 -  Feeling bad or failure about yourself  0 0 -  Trouble concentrating 1 0 -  Moving slowly or fidgety/restless 0 0 -  Suicidal thoughts 0 0 -  PHQ-9 Score 8 2 -  Difficult doing work/chores Not difficult at all Not difficult at all -     Allergies  Allergen Reactions  . Erythromycin Rash     Current Outpatient Medications:  .  ALPRAZolam (XANAX) 1 MG tablet, Take 1 tablet (1 mg total) by mouth daily as needed for anxiety., Disp: 15 tablet, Rfl: 0 .  ibuprofen (ADVIL,MOTRIN) 200 MG tablet, Take 200 mg by mouth every 6 (six) hours as needed., Disp: , Rfl:  .  loratadine (CLARITIN) 10 MG tablet, Take 10 mg by mouth daily., Disp: , Rfl:  .  omeprazole (PRILOSEC) 20 MG capsule, Take 1 capsule (20 mg total) by  mouth daily., Disp: 30 capsule, Rfl: 11 .  citalopram (CELEXA) 20 MG tablet, Take 1 tablet (20 mg total) by mouth daily., Disp: 30 tablet, Rfl: 1 .  triamcinolone (NASACORT) 55 MCG/ACT AERO nasal inhaler, Place 2 sprays into the nose daily., Disp: 16.5 mL, Rfl: 12  Review of Systems  Constitutional: Negative for irritability.  Respiratory: Positive for shortness of breath (with panic).   Cardiovascular: Positive for palpitations. Negative for chest pain.  Gastrointestinal: Positive for nausea.  Neurological: Positive for dizziness.  Psychiatric/Behavioral: Positive for decreased concentration. Negative for confusion and suicidal ideas. The patient is  nervous/anxious. The patient does not have insomnia.     Social History   Tobacco Use  . Smoking status: Never Smoker  . Smokeless tobacco: Current User  Substance Use Topics  . Alcohol use: Yes    Alcohol/week: 0.6 - 2.4 oz    Types: 1 - 4 Cans of beer per week   Objective:   BP 132/80 (BP Location: Left Arm, Patient Position: Sitting, Cuff Size: Large)   Pulse 84   Temp 98 F (36.7 C) (Oral)   Resp 16   Wt 205 lb (93 kg)   SpO2 96%   BMI 29.41 kg/m  Vitals:   08/18/17 1510  BP: 132/80  Pulse: 84  Resp: 16  Temp: 98 F (36.7 C)  TempSrc: Oral  SpO2: 96%  Weight: 205 lb (93 kg)     Physical Exam  Constitutional: He appears well-developed and well-nourished. No distress.  HENT:  Head: Normocephalic and atraumatic.  Right Ear: Tympanic membrane, external ear and ear canal normal.  Left Ear: Tympanic membrane, external ear and ear canal normal.  Nose: Mucosal edema present. Right sinus exhibits no maxillary sinus tenderness and no frontal sinus tenderness. Left sinus exhibits no maxillary sinus tenderness and no frontal sinus tenderness.  Mouth/Throat: Uvula is midline, oropharynx is clear and moist and mucous membranes are normal.  Eyes: Conjunctivae and EOM are normal. Pupils are equal, round, and reactive to light. No scleral icterus.  Neck: Neck supple. No thyromegaly present.  Cardiovascular: Normal rate, normal heart sounds and intact distal pulses.  No murmur heard. Pulmonary/Chest: Effort normal and breath sounds normal. No respiratory distress. He has no wheezes. He has no rales.  Lymphadenopathy:    He has no cervical adenopathy.  Skin: Skin is warm and dry. No rash noted.  Psychiatric: His speech is normal and behavior is normal. Judgment and thought content normal. His mood appears anxious. His affect is not blunt and not labile. Cognition and memory are normal. He expresses no suicidal ideation.  Vitals reviewed.       Assessment & Plan:        Problem List Items Addressed This Visit      Respiratory   Allergic rhinitis    Advised avoiding regular decongestant use Will d/c flonase and try nasacort intead - may be reaction to alcohol base Could try singulair  In the future if needed        Digestive   GERD (gastroesophageal reflux disease)    Control improving on Omeprazole Ok to continue Zantac prn Worsening could be related to anxiety If not well controlled with anxiety, consider GI referral for possible EGD      Relevant Medications   omeprazole (PRILOSEC) 20 MG capsule     Nervous and Auditory   Eustachian tube dysfunction    Related to allergic rhinitis Trial of nasacort as above  Other   Sinus pressure    Likely related to allergies Treat as above Avoid regular use of decongestants      Anxiety - Primary    Uncontrolled Agrees to try SSRI Will start Celexa 20mg  daily Discussed possible side effects including sexual dysfunction and GI upset and that it can take up to 6-8 weeks to reach full efficacy Discussed usefullness of therapy and possible CBT Ok to use Xanax sparingly for panic attacks (see below) F/u in 1 month, repeat GAD7, possible dose increase of Celexa      Relevant Medications   citalopram (CELEXA) 20 MG tablet   ALPRAZolam (XANAX) 1 MG tablet   Panic attacks    Refilled Xanax #15 for prn use OK to use sparingly Discussed risks of long term use Plan to discontinue when well controlled on SSRI      Relevant Medications   citalopram (CELEXA) 20 MG tablet   ALPRAZolam (XANAX) 1 MG tablet       Return in about 4 weeks (around 09/15/2017) for anxiety f/u.   The entirety of the information documented in the History of Present Illness, Review of Systems and Physical Exam were personally obtained by me. Portions of this information were initially documented by Irving BurtonEmily Ratchford, CMA and reviewed by me for thoroughness and accuracy.    Erasmo DownerBacigalupo, Curley Hogen M, MD, MPH [facility] 08/18/2017 5:04 PM

## 2017-08-18 NOTE — Patient Instructions (Addendum)
Eustachian Tube Dysfunction The eustachian tube connects the middle ear to the back of the nose. It regulates air pressure in the middle ear by allowing air to move between the ear and nose. It also helps to drain fluid from the middle ear space. When the eustachian tube does not function properly, air pressure, fluid, or both can build up in the middle ear. Eustachian tube dysfunction can affect one or both ears. What are the causes? This condition happens when the eustachian tube becomes blocked or cannot open normally. This may result from:  Ear infections.  Colds and other upper respiratory infections.  Allergies.  Irritation, such as from cigarette smoke or acid from the stomach coming up into the esophagus (gastroesophageal reflux).  Sudden changes in air pressure, such as from descending in an airplane.  Abnormal growths in the nose or throat, such as nasal polyps, tumors, or enlarged tissue at the back of the throat (adenoids).  What increases the risk? This condition may be more likely to develop in people who smoke and people who are overweight. Eustachian tube dysfunction may also be more likely to develop in children, especially children who have:  Certain birth defects of the mouth, such as cleft palate.  Large tonsils and adenoids.  What are the signs or symptoms? Symptoms of this condition may include:  A feeling of fullness in the ear.  Ear pain.  Clicking or popping noises in the ear.  Ringing in the ear.  Hearing loss.  Loss of balance.  Symptoms may get worse when the air pressure around you changes, such as when you travel to an area of high elevation or fly on an airplane. How is this diagnosed? This condition may be diagnosed based on:  Your symptoms.  A physical exam of your ear, nose, and throat.  Tests, such as those that measure: ? The movement of your eardrum (tympanogram). ? Your hearing (audiometry).  How is this treated? Treatment  depends on the cause and severity of your condition. If your symptoms are mild, you may be able to relieve your symptoms by moving air into ("popping") your ears. If you have symptoms of fluid in your ears, treatment may include:  Decongestants.  Antihistamines.  Nasal sprays or ear drops that contain medicines that reduce swelling (steroids).  In some cases, you may need to have a procedure to drain the fluid in your eardrum (myringotomy). In this procedure, a small tube is placed in the eardrum to:  Drain the fluid.  Restore the air in the middle ear space.  Follow these instructions at home:  Take over-the-counter and prescription medicines only as told by your health care provider.  Use techniques to help pop your ears as recommended by your health care provider. These may include: ? Chewing gum. ? Yawning. ? Frequent, forceful swallowing. ? Closing your mouth, holding your nose closed, and gently blowing as if you are trying to blow air out of your nose.  Do not do any of the following until your health care provider approves: ? Travel to high altitudes. ? Fly in airplanes. ? Work in a pressurized cabin or room. ? Scuba dive.  Keep your ears dry. Dry your ears completely after showering or bathing.  Do not smoke.  Keep all follow-up visits as told by your health care provider. This is important. Contact a health care provider if:  Your symptoms do not go away after treatment.  Your symptoms come back after treatment.  You are   unable to pop your ears.  You have: ? A fever. ? Pain in your ear. ? Pain in your head or neck. ? Fluid draining from your ear.  Your hearing suddenly changes.  You become very dizzy.  You lose your balance. This information is not intended to replace advice given to you by your health care provider. Make sure you discuss any questions you have with your health care provider. Document Released: 07/17/2015 Document Revised: 11/26/2015  Document Reviewed: 07/09/2014 Elsevier Interactive Patient Education  2018 [employer] Inc[employer]    Generalized Anxiety Disorder, Adult Generalized anxiety disorder (GAD) is a mental health disorder. People with this condition constantly worry about everyday events. Unlike normal anxiety, worry related to GAD is not triggered by a specific event. These worries also do not fade or get better with time. GAD interferes with life functions, including relationships, work, and school. GAD can vary from mild to severe. People with severe GAD can have intense waves of anxiety with physical symptoms (panic attacks). What are the causes? The exact cause of GAD is not known. What increases the risk? This condition is more likely to develop in:  Women.  People who have a family history of anxiety disorders.  People who are very shy.  People who experience very stressful life events, such as the death of a loved one.  People who have a very stressful family environment.  What are the signs or symptoms? People with GAD often worry excessively about many things in their lives, such as their health and family. They may also be overly concerned about:  Doing well at work.  Being on time.  Natural disasters.  Friendships.  Physical symptoms of GAD include:  Fatigue.  Muscle tension or having muscle twitches.  Trembling or feeling shaky.  Being easily startled.  Feeling like your heart is pounding or racing.  Feeling out of breath or like you cannot take a deep breath.  Having trouble falling asleep or staying asleep.  Sweating.  Nausea, diarrhea, or irritable bowel syndrome (IBS).  Headaches.  Trouble concentrating or remembering facts.  Restlessness.  Irritability.  How is this diagnosed? Your health care provider can diagnose GAD based on your symptoms and medical history. You will also have a physical exam. The health care provider will ask specific questions about your  symptoms, including how severe they are, when they started, and if they come and go. Your health care provider may ask you about your use of alcohol or drugs, including prescription medicines. Your health care provider may refer you to a mental health specialist for further evaluation. Your health care provider will do a thorough examination and may perform additional tests to rule out other possible causes of your symptoms. To be diagnosed with GAD, a person must have anxiety that:  Is out of his or her control.  Affects several different aspects of his or her life, such as work and relationships.  Causes distress that makes him or her unable to take part in normal activities.  Includes at least three physical symptoms of GAD, such as restlessness, fatigue, trouble concentrating, irritability, muscle tension, or sleep problems.  Before your health care provider can confirm a diagnosis of GAD, these symptoms must be present more days than they are not, and they must last for six months or longer. How is this treated? The following therapies are usually used to treat GAD:  Medicine. Antidepressant medicine is usually prescribed for long-term daily control. Antianxiety medicines may be added  in severe cases, especially when panic attacks occur.  Talk therapy (psychotherapy). Certain types of talk therapy can be helpful in treating GAD by providing support, education, and guidance. Options include: ? Cognitive behavioral therapy (CBT). People learn coping skills and techniques to ease their anxiety. They learn to identify unrealistic or negative thoughts and behaviors and to replace them with positive ones. ? Acceptance and commitment therapy (ACT). This treatment teaches people how to be mindful as a way to cope with unwanted thoughts and feelings. ? Biofeedback. This process trains you to manage your body's response (physiological response) through breathing techniques and relaxation methods. You  will work with a therapist while machines are used to monitor your physical symptoms.  Stress management techniques. These include yoga, meditation, and exercise.  A mental health specialist can help determine which treatment is best for you. Some people see improvement with one type of therapy. However, other people require a combination of therapies. Follow these instructions at home:  Take over-the-counter and prescription medicines only as told by your health care provider.  Try to maintain a normal routine.  Try to anticipate stressful situations and allow extra time to manage them.  Practice any stress management or self-calming techniques as taught by your health care provider.  Do not punish yourself for setbacks or for not making progress.  Try to recognize your accomplishments, even if they are small.  Keep all follow-up visits as told by your health care provider. This is important. Contact a health care provider if:  Your symptoms do not get better.  Your symptoms get worse.  You have signs of depression, such as: ? A persistently sad, cranky, or irritable mood. ? Loss of enjoyment in activities that used to bring you joy. ? Change in weight or eating. ? Changes in sleeping habits. ? Avoiding friends or family members. ? Loss of energy for normal tasks. ? Feelings of guilt or worthlessness. Get help right away if:  You have serious thoughts about hurting yourself or others. If you ever feel like you may hurt yourself or others, or have thoughts about taking your own life, get help right away. You can go to your nearest emergency department or call:  Your local emergency services (911 in the [place]  A suicide crisis helpline, such as the National Suicide Prevention Lifeline at (804) 743-01501-586-124-8683. This is open 24 hours a day.  Summary  Generalized anxiety disorder (GAD) is a mental health disorder that involves worry that is not triggered by a specific  event.  People with GAD often worry excessively about many things in their lives, such as their health and family.  GAD may cause physical symptoms such as restlessness, trouble concentrating, sleep problems, frequent sweating, nausea, diarrhea, headaches, and trembling or muscle twitching.  A mental health specialist can help determine which treatment is best for you. Some people see improvement with one type of therapy. However, other people require a combination of therapies. This information is not intended to replace advice given to you by your health care provider. Make sure you discuss any questions you have with your health care provider. Document Released: 10/15/2012 Document Revised: 05/10/2016 Document Reviewed: 05/10/2016 Elsevier Interactive Patient Education  [employer]2018 [employer]

## 2017-08-18 NOTE — Assessment & Plan Note (Signed)
Refilled Xanax #15 for prn use OK to use sparingly Discussed risks of long term use Plan to discontinue when well controlled on SSRI

## 2017-09-18 ENCOUNTER — Ambulatory Visit: Payer: 59 | Admitting: Family Medicine

## 2017-09-18 ENCOUNTER — Ambulatory Visit: Payer: Self-pay | Admitting: Family Medicine

## 2017-09-18 ENCOUNTER — Encounter: Payer: Self-pay | Admitting: Family Medicine

## 2017-09-18 VITALS — BP 140/70 | HR 80 | Resp 16 | Wt 210.0 lb

## 2017-09-18 DIAGNOSIS — R5383 Other fatigue: Secondary | ICD-10-CM | POA: Diagnosis not present

## 2017-09-18 DIAGNOSIS — F419 Anxiety disorder, unspecified: Secondary | ICD-10-CM

## 2017-09-18 DIAGNOSIS — F410 Panic disorder [episodic paroxysmal anxiety] without agoraphobia: Secondary | ICD-10-CM | POA: Diagnosis not present

## 2017-09-18 MED ORDER — CITALOPRAM HYDROBROMIDE 20 MG PO TABS: 20.0000 mg | ORAL_TABLET | Freq: Every day | ORAL | 3 refills | Status: DC

## 2017-09-18 NOTE — Progress Notes (Signed)
Patient: [name] [name] Male    DOB: January 07, 1986   32 y.o.   MRN: [id number] Visit Date: 09/18/2017  Today's Provider: Shirlee Latch, MD   I, Joslyn Hy, CMA, am acting as scribe for Shirlee Latch, MD.  Chief Complaint  Patient presents with  . Anxiety   Subjective:    Anxiety  Presents for follow-up (LOV 08/18/2017. Celexa 20 mg was prescribed for pt, and Xanax 1 mg was refilled for PRN use.) visit. Symptoms include dry mouth, insomnia (since moving), malaise, nervous/anxious behavior (improved), palpitations (with panic attacks) and panic (is improving; occurs about once weekly). Patient reports no chest pain, compulsions, confusion, decreased concentration, depressed mood, dizziness, excessive worry, feeling of choking, hyperventilation, irritability, muscle tension, nausea, obsessions, restlessness, shortness of breath or suicidal ideas. The severity of symptoms is mild. The quality of sleep is non-restorative. Nighttime awakenings: occasional.   Compliance with medications: good compliance. Treatment side effects: has noticed heat intolerance.   Only needed to use 1.5 of the Xanax pills early on when starting Celexa. None since. No further panic attacks.  Patient is also concerned about fatigue and nonrestorative sleep.  He only sleeps a maximum of 6 hours per night.  He is responsible for feeding and changing his 67-year-old twins at around 25 PM and then he has to be up for work at 5:30 AM every day.  His wife has told him that he snores and has infrequent episodes of apnea within the last several years.  He feels like his sleep may have improved some on the Celexa and he is interested in seeing if it continues to improve.     Allergies  Allergen Reactions  . Erythromycin Rash     Current Outpatient Medications:  .  ALPRAZolam (XANAX) 1 MG tablet, Take 1 tablet (1 mg total) by mouth daily as needed for anxiety., Disp: 15 tablet, Rfl: 0 .  citalopram (CELEXA)  20 MG tablet, Take 1 tablet (20 mg total) by mouth daily., Disp: 30 tablet, Rfl: 1 .  omeprazole (PRILOSEC) 20 MG capsule, Take 1 capsule (20 mg total) by mouth daily., Disp: 30 capsule, Rfl: 11 .  ibuprofen (ADVIL,MOTRIN) 200 MG tablet, Take 200 mg by mouth every 6 (six) hours as needed., Disp: , Rfl:  .  loratadine (CLARITIN) 10 MG tablet, Take 10 mg by mouth daily., Disp: , Rfl:  .  triamcinolone (NASACORT) 55 MCG/ACT AERO nasal inhaler, Place 2 sprays into the nose daily. (Patient not taking: Reported on 09/18/2017), Disp: 16.5 mL, Rfl: 12  Review of Systems  Constitutional: Negative for irritability.  HENT: Positive for sore throat.   Respiratory: Negative for shortness of breath.   Cardiovascular: Positive for palpitations (with panic attacks). Negative for chest pain.  Gastrointestinal: Negative for nausea.  Endocrine: Positive for heat intolerance.  Allergic/Immunologic: Positive for environmental allergies.  Neurological: Negative for dizziness.  Psychiatric/Behavioral: Negative for confusion, decreased concentration and suicidal ideas. The patient is nervous/anxious (improved) and has insomnia (since moving).     Social History   Tobacco Use  . Smoking status: Never Smoker  . Smokeless tobacco: Current User  Substance Use Topics  . Alcohol use: Yes    Alcohol/week: 0.6 - 2.4 oz    Types: 1 - 4 Cans of beer per week   Objective:   BP 140/70 (BP Location: Left Arm, Patient Position: Sitting, Cuff Size: Large)   Pulse 80   Resp 16   Wt 210 lb (95.3 kg)  SpO2 98%   BMI 30.13 kg/m  Vitals:   09/18/17 1541  BP: 140/70  Pulse: 80  Resp: 16  SpO2: 98%  Weight: 210 lb (95.3 kg)     Physical Exam  Constitutional: He is oriented to person, place, and time. He appears well-developed and well-nourished. No distress.  HENT:  Head: Normocephalic and atraumatic.  Eyes: Conjunctivae are normal. No scleral icterus.  Neck: Neck supple. No thyromegaly present.    Cardiovascular: Normal rate, regular rhythm, normal heart sounds and intact distal pulses.  No murmur heard. Pulmonary/Chest: Effort normal and breath sounds normal. No respiratory distress. He has no wheezes. He has no rales.  Musculoskeletal: He exhibits no edema.  Neurological: He is alert and oriented to person, place, and time.  Skin: Skin is warm. No rash noted.  Psychiatric: He has a normal mood and affect. His behavior is normal.  Vitals reviewed.   Depression screen [facility] 2/9 09/18/2017 08/18/2017 06/01/2017 04/25/2017  Decreased Interest 0 1 0 0  Down, Depressed, Hopeless 0 1 0 0  PHQ - 2 Score 0 2 0 0  Altered sleeping 1 2 1  -  Tired, decreased energy 2 2 1  -  Change in appetite 0 1 0 -  Feeling bad or failure about yourself  0 0 0 -  Trouble concentrating 0 1 0 -  Moving slowly or fidgety/restless 0 0 0 -  Suicidal thoughts 0 0 0 -  PHQ-9 Score 3 8 2  -  Difficult doing work/chores Not difficult at all Not difficult at all Not difficult at all -   GAD 7 : Generalized Anxiety Score 09/18/2017 08/18/2017 06/01/2017  Nervous, Anxious, on Edge 1 3 2   Control/stop worrying 1 2 1   Worry too much - different things 1 2 1   Trouble relaxing 0 1 1  Restless 0 1 1  Easily annoyed or irritable 1 1 1   Afraid - awful might happen 1 1 1   Total GAD 7 Score 5 11 8   Anxiety Difficulty - Not difficult at all Not difficult at all      Assessment & Plan:      Problem List Items Addressed This Visit      Other   Anxiety - Primary    Control much improved Offered dose increase of Celexa, but patient recognizes that it is still in the 6-8-week period for her to reach full efficacy We will continue on Celexa 20 mg daily for now      Relevant Medications   citalopram (CELEXA) 20 MG tablet   Other Relevant Orders   TSH (Completed)   CBC (Completed)   Comprehensive metabolic panel (Completed)   Panic attacks    Resolved on Celexa Discontinue Xanax      Relevant Medications    citalopram (CELEXA) 20 MG tablet   Fatigue    Likely multifactorial from lack of sleep due to having 3 young children, anxiety, possible sleep apnea Discussed sleep apnea with patient and offered sleep study, but he wishes to hold off on this for now We will see if staying on the Celexa continues to help Check for other causes with CBC, CMP, TSH Patient does have family history of thyroid disease At next follow-up, can consider sleep study again if continues to have poor sleep      Relevant Orders   TSH (Completed)   CBC (Completed)   Comprehensive metabolic panel (Completed)      Return in about 3 months (around 12/19/2017) for fatigue f/u.  The entirety of the information documented in the History of Present Illness, Review of Systems and Physical Exam were personally obtained by me. Portions of this information were initially documented by Irving Burton Ratchford, CMA and reviewed by me for thoroughness and accuracy.    [name], MD, MPH [facility] 09/19/2017 9:35 AM

## 2017-09-19 DIAGNOSIS — R5383 Other fatigue: Secondary | ICD-10-CM | POA: Insufficient documentation

## 2017-09-19 LAB — COMPREHENSIVE METABOLIC PANEL
ALT: 27 IU/L (ref 0–44)
AST: 22 IU/L (ref 0–40)
Albumin/Globulin Ratio: 2.4 — ABNORMAL HIGH (ref 1.2–2.2)
Albumin: 5 g/dL (ref 3.5–5.5)
Alkaline Phosphatase: 93 IU/L (ref 39–117)
BUN/Creatinine Ratio: 18 (ref 9–20)
BUN: 15 mg/dL (ref 6–20)
Bilirubin Total: 0.2 mg/dL (ref 0.0–1.2)
CO2: 21 mmol/L (ref 20–29)
CREATININE: 0.85 mg/dL (ref 0.76–1.27)
Calcium: 9.9 mg/dL (ref 8.7–10.2)
Chloride: 101 mmol/L (ref 96–106)
GFR calc Af Amer: 136 mL/min/{1.73_m2} (ref >59)
GFR, EST NON AFRICAN AMERICAN: 118 mL/min/{1.73_m2} (ref >59)
Globulin, Total: 2.1 g/dL (ref 1.5–4.5)
Glucose: 105 mg/dL — ABNORMAL HIGH (ref 65–99)
Potassium: 3.9 mmol/L (ref 3.5–5.2)
Sodium: 140 mmol/L (ref 134–144)
Total Protein: 7.1 g/dL (ref 6.0–8.5)

## 2017-09-19 LAB — CBC
Hematocrit: 44.7 % (ref 37.5–51.0)
Hemoglobin: 15.4 g/dL (ref 13.0–17.7)
MCH: 30.4 pg (ref 26.6–33.0)
MCHC: 34.5 g/dL (ref 31.5–35.7)
MCV: 88 fL (ref 79–97)
PLATELETS: 217 10*3/uL (ref 150–379)
RBC: 5.06 x10E6/uL (ref 4.14–5.80)
RDW: 13.6 % (ref 12.3–15.4)
WBC: 7.4 10*3/uL (ref 3.4–10.8)

## 2017-09-19 LAB — TSH: TSH: 1.32 u[IU]/mL (ref 0.450–4.500)

## 2017-09-19 NOTE — Assessment & Plan Note (Signed)
Control much improved Offered dose increase of Celexa, but patient recognizes that it is still in the 6-8-week period for her to reach full efficacy We will continue on Celexa 20 mg daily for now

## 2017-09-19 NOTE — Assessment & Plan Note (Signed)
Likely multifactorial from lack of sleep due to having 3 young children, anxiety, possible sleep apnea Discussed sleep apnea with patient and offered sleep study, but he wishes to hold off on this for now We will see if staying on the Celexa continues to help Check for other causes with CBC, CMP, TSH Patient does have family history of thyroid disease At next follow-up, can consider sleep study again if continues to have poor sleep

## 2017-09-19 NOTE — Assessment & Plan Note (Signed)
Resolved on Celexa Discontinue Xanax

## 2017-09-20 ENCOUNTER — Telehealth: Payer: Self-pay

## 2017-09-20 NOTE — Telephone Encounter (Signed)
-----   Message from Erasmo DownerAngela M Bacigalupo, MD sent at 09/20/2017  8:26 AM EDT ----- Normal Thyroid function, kidney function, liver function, electrolytes, Blood counts.  Erasmo DownerBacigalupo, Angela M, MD, MPH [facility] 09/20/2017 8:26 AM

## 2017-09-20 NOTE — Telephone Encounter (Signed)
Pt advised.

## 2017-09-26 DIAGNOSIS — Z719 Counseling, unspecified: Secondary | ICD-10-CM | POA: Diagnosis not present

## 2017-12-20 ENCOUNTER — Ambulatory Visit: Payer: 59 | Admitting: Family Medicine

## 2017-12-20 ENCOUNTER — Encounter: Payer: Self-pay | Admitting: Family Medicine

## 2017-12-20 VITALS — BP 128/72 | HR 77 | Temp 98.2°F | Resp 16 | Wt 204.0 lb

## 2017-12-20 DIAGNOSIS — G478 Other sleep disorders: Secondary | ICD-10-CM | POA: Diagnosis not present

## 2017-12-20 DIAGNOSIS — R5382 Chronic fatigue, unspecified: Secondary | ICD-10-CM | POA: Diagnosis not present

## 2017-12-20 DIAGNOSIS — G43109 Migraine with aura, not intractable, without status migrainosus: Secondary | ICD-10-CM

## 2017-12-20 DIAGNOSIS — F419 Anxiety disorder, unspecified: Secondary | ICD-10-CM

## 2017-12-20 MED ORDER — CITALOPRAM HYDROBROMIDE 40 MG PO TABS: 40.0000 mg | ORAL_TABLET | Freq: Every day | ORAL | 3 refills | Status: DC

## 2017-12-20 NOTE — Patient Instructions (Addendum)
Keep a headache diary with severity, location, timing, and other symptoms with headaches  Migraine Headache A migraine headache is an intense, throbbing pain on one side or both sides of the head. Migraines may also cause other symptoms, such as nausea, vomiting, and sensitivity to light and noise. What are the causes? Doing or taking certain things may also trigger migraines, such as:  Alcohol.  Smoking.  Medicines, such as: ? Medicine used to treat chest pain (nitroglycerine). ? Birth control pills. ? Estrogen pills. ? Certain blood pressure medicines.  Aged cheeses, chocolate, or caffeine.  Foods or drinks that contain nitrates, glutamate, aspartame, or tyramine.  Physical activity.  Other things that may trigger a migraine include:  Menstruation.  Pregnancy.  Hunger.  Stress, lack of sleep, too much sleep, or fatigue.  Weather changes.  What increases the risk? The following factors may make you more likely to experience migraine headaches:  Age. Risk increases with age.  Family history of migraine headaches.  Being Caucasian.  Depression and anxiety.  Obesity.  Being a woman.  Having a hole in the heart (patent foramen ovale) or other heart problems.  What are the signs or symptoms? The main symptom of this condition is pulsating or throbbing pain. Pain may:  Happen in any area of the head, such as on one side or both sides.  Interfere with daily activities.  Get worse with physical activity.  Get worse with exposure to bright lights or loud noises.  Other symptoms may include:  Nausea.  Vomiting.  Dizziness.  General sensitivity to bright lights, loud noises, or smells.  Before you get a migraine, you may get warning signs that a migraine is developing (aura). An aura may include:  Seeing flashing lights or having blind spots.  Seeing bright spots, halos, or zigzag lines.  Having tunnel vision or blurred vision.  Having numbness  or a tingling feeling.  Having trouble talking.  Having muscle weakness.  How is this diagnosed? A migraine headache can be diagnosed based on:  Your symptoms.  A physical exam.  Tests, such as CT scan or MRI of the head. These imaging tests can help rule out other causes of headaches.  Taking fluid from the spine (lumbar puncture) and analyzing it (cerebrospinal fluid analysis, or CSF analysis).  How is this treated? A migraine headache is usually treated with medicines that:  Relieve pain.  Relieve nausea.  Prevent migraines from coming back.  Treatment may also include:  Acupuncture.  Lifestyle changes like avoiding foods that trigger migraines.  Follow these instructions at home: Medicines  Take over-the-counter and prescription medicines only as told by your health care provider.  Do not drive or use heavy machinery while taking prescription pain medicine.  To prevent or treat constipation while you are taking prescription pain medicine, your health care provider may recommend that you: ? Drink enough fluid to keep your urine clear or pale yellow. ? Take over-the-counter or prescription medicines. ? Eat foods that are high in fiber, such as fresh fruits and vegetables, whole grains, and beans. ? Limit foods that are high in fat and processed sugars, such as fried and sweet foods. Lifestyle  Avoid alcohol use.  Do not use any products that contain nicotine or tobacco, such as cigarettes and e-cigarettes. If you need help quitting, ask your health care provider.  Get at least 8 hours of sleep every night.  Limit your stress. General instructions   Keep a journal to find out what  may trigger your migraine headaches. For example, write down: ? What you eat and drink. ? How much sleep you get. ? Any change to your diet or medicines.  If you have a migraine: ? Avoid things that make your symptoms worse, such as bright lights. ? It may help to lie down in  a dark, quiet room. ? Do not drive or use heavy machinery. ? Ask your health care provider what activities are safe for you while you are experiencing symptoms.  Keep all follow-up visits as told by your health care provider. This is important. Contact a health care provider if:  You develop symptoms that are different or more severe than your usual migraine symptoms. Get help right away if:  Your migraine becomes severe.  You have a fever.  You have a stiff neck.  You have vision loss.  Your muscles feel weak or like you cannot control them.  You start to lose your balance often.  You develop trouble walking.  You faint. This information is not intended to replace advice given to you by your health care provider. Make sure you discuss any questions you have with your health care provider. Document Released: 06/20/2005 Document Revised: 01/08/2016 Document Reviewed: 12/07/2015 Elsevier Interactive Patient Education  2017 [employer]

## 2017-12-20 NOTE — Progress Notes (Addendum)
Disregard

## 2017-12-21 NOTE — Assessment & Plan Note (Signed)
New problem for me, but not new for the patient He was diagnosed with migraines as a child.   Aura seems new, but discussed with patient what this is and how it is present with migraines for some people He does not have many details about his headaches/possible migraines We will have him keep a headache diary until his next follow-up so we can determine how frequent these are We could consider Imitrex to use when he has a migraine, but he seems to think these are not severe enough for that We could consider preventive therapy if he is having these frequently

## 2017-12-21 NOTE — Progress Notes (Signed)
Patient: [name] GoltzJohn C Pelley Male    DOB: 05-07-1986   32 y.o.   MRN: [id number] Visit Date: 12/21/2017  Today's Provider: Shirlee LatchAngela Bacigalupo, MD   I, Joslyn HyEmily Ratchford, CMA, am acting as scribe for Shirlee LatchAngela Bacigalupo, MD.  Chief Complaint  Patient presents with  . Fatigue   Subjective:    HPI     Follow up for Fatigue  The patient was last seen for this 3 months ago. Changes made at last visit include continuing Celexa 20 mg. Labs at the time were WNL. Pt declined sleep study at LOV.  He reports good compliance with treatment. He feels that condition is Improved, but is not to goal. Pt states he is having more difficulty falling asleep, and is waking up more in the night.  Pt states his anxiety has improved with Celexa use. He is also taking alprazolam PRN.  Continues to complain of nonrestorative sleep.  Stopped drinking energy drinks about 1 month ago.  He states that his wife has told him that he snores and complained about pauses in his breathing only once.  He does not have any headaches in the mornings when he wakes up, but he does have intermittent headaches that are preceded by an aura of bright flashing lights.  He has not taken note of how often these occur or where the pain is located in his head when these occur.  He states that he was diagnosed with migraines as a child and took Imitrex previously.  He states these are less severe than his previous migraines were and he has minimal photophobia and phonophobia with them.  He states that his wife is also been having similar symptoms recently and he wonders if having 3 young children is affecting them both in terms of quality of sleep.  He states that when he is having difficulty falling asleep, his mind is racing, he cannot get comfortable in the bed. ------------------------------------------------------------------------------------    Allergies  Allergen Reactions  . Erythromycin Rash     Current Outpatient  Medications:  .  ALPRAZolam (XANAX) 1 MG tablet, Take 1 tablet (1 mg total) by mouth daily as needed for anxiety., Disp: 15 tablet, Rfl: 0 .  cetirizine-pseudoephedrine (ZYRTEC-D) 5-120 MG tablet, Take 1 tablet by mouth 2 (two) times daily., Disp: , Rfl:  .  citalopram (CELEXA) 40 MG tablet, Take 1 tablet (40 mg total) by mouth daily., Disp: 30 tablet, Rfl: 3 .  omeprazole (PRILOSEC) 20 MG capsule, Take 1 capsule (20 mg total) by mouth daily., Disp: 30 capsule, Rfl: 11  Review of Systems  Constitutional: Positive for fatigue. Negative for activity change, appetite change, chills, diaphoresis, fever and unexpected weight change.  HENT: Negative.   Respiratory: Negative.   Cardiovascular: Negative.   Gastrointestinal: Negative.   Genitourinary: Negative.   Musculoskeletal: Negative.   Neurological: Positive for headaches. Negative for dizziness, tremors, seizures, syncope, facial asymmetry, speech difficulty, weakness, light-headedness and numbness.  Hematological: Negative.   Psychiatric/Behavioral: Positive for sleep disturbance. The patient is nervous/anxious.     Social History   Tobacco Use  . Smoking status: Never Smoker  . Smokeless tobacco: Current User  Substance Use Topics  . Alcohol use: Yes    Alcohol/week: 0.6 - 2.4 oz    Types: 1 - 4 Cans of beer per week   Objective:   BP 128/72 (BP Location: Left Arm, Patient Position: Sitting, Cuff Size: Large)   Pulse 77   Temp 98.2 F (36.8 C) (Oral)  Resp 16   Wt 204 lb (92.5 kg)   SpO2 98%   BMI 29.27 kg/m  Vitals:   12/20/17 1617  BP: 128/72  Pulse: 77  Resp: 16  Temp: 98.2 F (36.8 C)  TempSrc: Oral  SpO2: 98%  Weight: 204 lb (92.5 kg)     Physical Exam  Constitutional: He is oriented to person, place, and time. He appears well-developed and well-nourished. No distress.  HENT:  Head: Normocephalic and atraumatic.  Eyes: Conjunctivae are normal. Right eye exhibits no discharge. Left eye exhibits no  discharge. No scleral icterus.  Neck: Neck supple. No thyromegaly present.  Cardiovascular: Normal rate, regular rhythm, normal heart sounds and intact distal pulses.  No murmur heard. Pulmonary/Chest: Effort normal and breath sounds normal. No respiratory distress. He has no wheezes. He has no rales.  Musculoskeletal: He exhibits no edema.  Lymphadenopathy:    He has no cervical adenopathy.  Neurological: He is alert and oriented to person, place, and time.  Skin: Skin is warm and dry. Capillary refill takes less than 2 seconds. No rash noted.  Psychiatric: His speech is normal and behavior is normal. Judgment and thought content normal. His mood appears anxious. His affect is not blunt and not labile. Cognition and memory are normal. He does not exhibit a depressed mood.  Vitals reviewed.       Assessment & Plan:   Problem List Items Addressed This Visit      Cardiovascular and Mediastinum   Migraine with aura    New problem for me, but not new for the patient He was diagnosed with migraines as a child.   Aura seems new, but discussed with patient what this is and how it is present with migraines for some people He does not have many details about his headaches/possible migraines We will have him keep a headache diary until his next follow-up so we can determine how frequent these are We could consider Imitrex to use when he has a migraine, but he seems to think these are not severe enough for that We could consider preventive therapy if he is having these frequently      Relevant Medications   citalopram (CELEXA) 40 MG tablet     Nervous and Auditory   Non-restorative sleep    Patient continues to have nonrestorative sleep in the setting of snoring and possible episodes of apnea I have suggested sleep study, but he continues to decline We will see if improving his anxiety improves his sleep quality        Other   Anxiety - Primary    Day-to-day control is much improved  from prior to starting Celexa He does have difficulty with insomnia which seems to be related to racing thoughts and anxiety We will increase his dose of Celexa to 40 mg daily Hopefully this will help with sleep quality and nighttime anxiety symptoms Hope that he will not continue to need Xanax in the future      Relevant Medications   citalopram (CELEXA) 40 MG tablet   Fatigue    Continue to suspect that this is a multifactorial problem from having 3 young children, anxiety, possible sleep apnea Patient continues to decline sleep study at this time We will see if increasing his Celexa dose helps improve his ability to fall asleep He did have normal CBC, CMP, TSH          Return in about 3 months (around 03/22/2018) for anxiety, sleep, headaches f/u.  The entirety of the information documented in the History of Present Illness, Review of Systems and Physical Exam were personally obtained by me. Portions of this information were initially documented by Irving Burton Ratchford, CMA and reviewed by me for thoroughness and accuracy.    [name], MD, MPH [facility] 12/21/2017 9:03 AM

## 2017-12-21 NOTE — Assessment & Plan Note (Signed)
Day-to-day control is much improved from prior to starting Celexa He does have difficulty with insomnia which seems to be related to racing thoughts and anxiety We will increase his dose of Celexa to 40 mg daily Hopefully this will help with sleep quality and nighttime anxiety symptoms Hope that he will not continue to need Xanax in the future

## 2017-12-21 NOTE — Assessment & Plan Note (Signed)
Patient continues to have nonrestorative sleep in the setting of snoring and possible episodes of apnea I have suggested sleep study, but he continues to decline We will see if improving his anxiety improves his sleep quality

## 2017-12-21 NOTE — Assessment & Plan Note (Signed)
Continue to suspect that this is a multifactorial problem from having 3 young children, anxiety, possible sleep apnea Patient continues to decline sleep study at this time We will see if increasing his Celexa dose helps improve his ability to fall asleep He did have normal CBC, CMP, TSH

## 2017-12-22 ENCOUNTER — Telehealth: Payer: Self-pay | Admitting: Family Medicine

## 2017-12-22 MED ORDER — ALPRAZOLAM 1 MG PO TABS: 1.0000 mg | ORAL_TABLET | Freq: Every day | ORAL | 0 refills | Status: DC | PRN

## 2017-12-22 NOTE — Telephone Encounter (Signed)
Please review

## 2017-12-22 NOTE — Telephone Encounter (Signed)
Pt needs refill on his Alprazolam 1 mg .  He is going out of town tomorrow and would like a refill today  [facility]  CB# [phone]  Barth Kirksteri

## 2017-12-22 NOTE — Telephone Encounter (Signed)
Refill sent.  Please let him know  Bacigalupo, Marzella SchleinAngela M, MD, MPH [facility] 12/22/2017 10:30 AM

## 2017-12-22 NOTE — Telephone Encounter (Signed)
Pt advised.

## 2018-01-17 ENCOUNTER — Other Ambulatory Visit: Payer: Self-pay | Admitting: Family Medicine

## 2018-03-22 ENCOUNTER — Ambulatory Visit: Payer: Self-pay | Admitting: Family Medicine

## 2018-03-22 NOTE — Progress Notes (Deleted)
       Patient: [name] GoltzJohn C [name] Male    DOB: 1985-09-20   32 y.o.   MRN: [id number] Visit Date: 03/22/2018  Today's Provider: Shirlee LatchAngela Bacigalupo, MD   No chief complaint on file.  Subjective:    HPI Follow up of Anxiety: Patient was last seen for this problem 3 months ago. Changes made during that visit includes increasing Celexa to 40 mg. Patient reports good compliance with treatment, good tolerance and good symptom control.     Allergies  Allergen Reactions  . Erythromycin Rash     Current Outpatient Medications:  .  ALPRAZolam (XANAX) 1 MG tablet, Take 1 tablet (1 mg total) by mouth daily as needed for anxiety., Disp: 15 tablet, Rfl: 0 .  cetirizine-pseudoephedrine (ZYRTEC-D) 5-120 MG tablet, Take 1 tablet by mouth 2 (two) times daily., Disp: , Rfl:  .  citalopram (CELEXA) 20 MG tablet, TAKE 1 TABLET BY MOUTH EVERY DAY, Disp: 30 tablet, Rfl: 3 .  citalopram (CELEXA) 40 MG tablet, Take 1 tablet (40 mg total) by mouth daily., Disp: 30 tablet, Rfl: 3 .  omeprazole (PRILOSEC) 20 MG capsule, Take 1 capsule (20 mg total) by mouth daily., Disp: 30 capsule, Rfl: 11  Review of Systems  Constitutional: Negative for appetite change, chills and fever.  Respiratory: Negative for chest tightness, shortness of breath and wheezing.   Cardiovascular: Negative for chest pain and palpitations.  Gastrointestinal: Negative for abdominal pain, nausea and vomiting.    Social History   Tobacco Use  . Smoking status: Never Smoker  . Smokeless tobacco: Current User  Substance Use Topics  . Alcohol use: Yes    Alcohol/week: 1.0 - 4.0 standard drinks    Types: 1 - 4 Cans of beer per week   Objective:   There were no vitals taken for this visit. There were no vitals filed for this visit.   Physical Exam      Assessment & Plan:           Shirlee LatchAngela Bacigalupo, MD  [facility]

## 2018-05-10 ENCOUNTER — Encounter: Payer: Self-pay | Admitting: Family Medicine

## 2018-05-11 ENCOUNTER — Encounter: Payer: Self-pay | Admitting: Family Medicine

## 2018-06-05 ENCOUNTER — Other Ambulatory Visit: Payer: Self-pay | Admitting: Family Medicine

## 2018-06-05 NOTE — Telephone Encounter (Signed)
Pt requesting refill of ALPRAZolam (XANAX) 1 MG [facility] [address]

## 2018-06-06 NOTE — Telephone Encounter (Signed)
Patient scheduled for 06/08/2018

## 2018-06-06 NOTE — Telephone Encounter (Signed)
Patient was supposed to f/u in 03/2018 for anxiety.  He will need to be seen to discuss anxiety before refill is considered  Erasmo DownerBacigalupo, Angela M, MD, MPH [facility] 06/06/2018 8:15 AM

## 2018-06-08 ENCOUNTER — Encounter: Payer: Self-pay | Admitting: Family Medicine

## 2018-06-08 ENCOUNTER — Ambulatory Visit: Payer: 59 | Admitting: Family Medicine

## 2018-06-08 VITALS — BP 132/88 | HR 67 | Temp 98.4°F | Wt 209.2 lb

## 2018-06-08 DIAGNOSIS — F410 Panic disorder [episodic paroxysmal anxiety] without agoraphobia: Secondary | ICD-10-CM

## 2018-06-08 DIAGNOSIS — F419 Anxiety disorder, unspecified: Secondary | ICD-10-CM | POA: Diagnosis not present

## 2018-06-08 MED ORDER — ALPRAZOLAM 1 MG PO TABS: 0.5000 mg | ORAL_TABLET | Freq: Every day | ORAL | 1 refills | Status: DC | PRN

## 2018-06-08 NOTE — Patient Instructions (Addendum)
Cut back to Celexa 10 mg (1/2 pill) daily for 1 week and then every other day for 1 week and then stop  Panic Attack A panic attack is a sudden episode of severe anxiety, fear, or discomfort that causes physical and emotional symptoms. The attack may be in response to something frightening, or it may occur for no known reason. Symptoms of a panic attack can be similar to symptoms of a heart attack or stroke. It is important to see your health care provider when you have a panic attack so that these conditions can be ruled out. A panic attack is a symptom of another condition. Most panic attacks go away with treatment of the underlying problem. If you have panic attacks often, you may have a condition called panic disorder. What are the causes? A panic attack may be caused by:  An extreme, life-threatening situation, such as a war or natural disaster.  An anxiety disorder, such as post-traumatic stress disorder.  Depression.  Certain medical conditions, including heart problems, neurological conditions, and infections.  Certain over-the-counter and prescription medicines.  Illegal drugs that increase heart rate and blood pressure, such as methamphetamine.  Alcohol.  Supplements that increase anxiety.  Panic disorder.  What increases the risk? You are more likely to develop this condition if:  You have an anxiety disorder.  You have another mental health condition.  You take certain medicines.  You use alcohol, illegal drugs, or other substances.  You are under extreme stress.  A life event is causing increased feelings of anxiety and depression.  What are the signs or symptoms? A panic attack starts suddenly, usually lasts about 20 minutes, and occurs with one or more of the following:  A pounding heart.  A feeling that your heart is beating irregularly or faster than normal (palpitations).  Sweating.  Trembling or shaking.  Shortness of breath or feeling  smothered.  Feeling choked.  Chest pain or discomfort.  Nausea or a strange feeling in your stomach.  Dizziness, feeling lightheaded, or feeling like you might faint.  Chills or hot flashes.  Numbness or tingling in your lips, hands, or feet.  Feeling confused, or feeling that you are not yourself.  Fear of losing control or being emotionally unstable.  Fear of dying.  How is this diagnosed? A panic attack is diagnosed with an assessment by your health care provider. During the assessment your health care provider will ask questions about:  Your history of anxiety, depression, and panic attacks.  Your medical history.  Whether you drink alcohol, use illegal drugs, take supplements, or take medicines. Be honest about your substance use.  Your health care provider may also:  Order blood tests or other kinds of tests to rule out serious medical conditions.  Refer you to a mental health professional for further evaluation.  How is this treated? Treatment depends on the cause of the panic attack:  If the cause is a medical problem, your health care provider will either treat that problem or refer you to a specialist.  If the cause is emotional, you may be given anti-anxiety medicines or referred to a counselor. These medicines may reduce how often attacks happen, reduce how severe the attacks are, and lower anxiety.  If the cause is a medicine, your health care provider may tell you to stop the medicine, change your dose, or take a different medicine.  If the cause is a drug, treatment may involve letting the drug wear off and taking medicine  to help the drug leave your body or to counteract its effects. Attacks caused by drug abuse may continue even if you stop using the drug.  Follow these instructions at home:  Take over-the-counter and prescription medicines only as told by your health care provider.  If you feel anxious, limit your caffeine intake.  Take good care  of your physical and mental health by: ? Eating a balanced diet that includes plenty of fresh fruits and vegetables, whole grains, lean meats, and low-fat dairy. ? Getting plenty of rest. Try to get 7-8 hours of uninterrupted sleep each night. ? Exercising regularly. Try to get 30 minutes of physical activity at least 5 days a week. ? Not smoking. Talk to your health care provider if you need help quitting. ? Limiting alcohol intake to no more than 1 drink a day for nonpregnant women and 2 drinks a day for men. One drink equals 12 oz of beer, 5 oz of wine, or 1 oz of hard liquor.  Keep all follow-up visits as told by your health care provider. This is important. Panic attacks may have underlying physical or emotional problems that take time to accurately diagnose. Contact a health care provider if:  Your symptoms do not improve, or they get worse.  You are not able to take your medicine as prescribed because of side effects. Get help right away if:  You have serious thoughts about hurting yourself or others.  You have symptoms of a panic attack. Do not drive yourself to the hospital. Have someone else drive you or call an ambulance. If you ever feel like you may hurt yourself or others, or you have thoughts about taking your own life, get help right away. You can go to your nearest emergency department or call:  Your local emergency services (911 in the [place]  A suicide crisis helpline, such as the National Suicide Prevention Lifeline at 956-017-0840. This is open 24 hours a day.  Summary  A panic attack is a sign of a serious health or mental health condition. Get help right away. Do not drive yourself to the hospital. Have someone else drive you or call an ambulance.  Always see a health care provider to have the reasons for the panic attack correctly diagnosed.  If your panic attack was caused by a physical problem, follow your health care provider's suggestions for medicine,  referral to a specialist, and lifestyle changes.  If your panic attack was caused by an emotional problem, follow through with counseling from a qualified mental health specialist.  If you feel like you may hurt yourself or others, call 911 and get help right away. This information is not intended to replace advice given to you by your health care provider. Make sure you discuss any questions you have with your health care provider. Document Released: 06/20/2005 Document Revised: 07/29/2016 Document Reviewed: 07/29/2016 Elsevier Interactive Patient Education  [employer].

## 2018-06-08 NOTE — Progress Notes (Signed)
Patient: [name] [name] Male    DOB: 1986-04-28   32 y.o.   MRN: [id number] Visit Date: 06/12/2018  Today's Provider: Shirlee Latch, MD   Chief Complaint  Patient presents with  . Anxiety   Subjective:    HPI Anxiety:  Patient presents for a 6 month follow up. Last OV was on 12/20/2017. Patient advised to increase Celexa to 40 mg and continue try to decrease use of Alprazolam. He reports good compliance with treatment plan. He states symptoms are controlled.  He feels like his symptoms are much better controlled as his life is much less stressful.  He feels like everything is going on 6 months ago has settled down some.  He never increase Celexa to 40 mg daily and is only taking 20 mg daily currently.  He takes Xanax very infrequently.  He was last prescribed 15 pills of Xanax 1 mg on 12/22/2017.  He states he still has a few pills left from them.  He only uses this when he has panic symptoms.  He does not feel like his panic symptoms have decreased significantly from Celexa.  He thinks his triggers are just less now.  He would like to get off Celexa if possible.   GAD 7 : Generalized Anxiety Score 06/08/2018 09/18/2017 08/18/2017 06/01/2017  Nervous, Anxious, on Edge 1 1 3 2   Control/stop worrying 1 1 2 1   Worry too much - different things 1 1 2 1   Trouble relaxing 1 0 1 1  Restless 1 0 1 1  Easily annoyed or irritable 1 1 1 1   Afraid - awful might happen 0 1 1 1   Total GAD 7 Score 6 5 11 8   Anxiety Difficulty Not difficult at all - Not difficult at all Not difficult at all       Allergies  Allergen Reactions  . Erythromycin Rash     Current Outpatient Medications:  .  ALPRAZolam (XANAX) 1 MG tablet, Take 0.5-1 tablets (0.5-1 mg total) by mouth daily as needed for anxiety., Disp: 15 tablet, Rfl: 1 .  cetirizine-pseudoephedrine (ZYRTEC-D) 5-120 MG tablet, Take 1 tablet by mouth 2 (two) times daily., Disp: , Rfl:  .  omeprazole (PRILOSEC) 20 MG capsule, Take 1 capsule (20  mg total) by mouth daily., Disp: 30 capsule, Rfl: 11   Review of Systems  Constitutional: Negative.   Respiratory: Negative.   Cardiovascular: Negative.   Musculoskeletal: Negative.   Psychiatric/Behavioral: The patient is nervous/anxious.     Social History   Tobacco Use  . Smoking status: Never Smoker  . Smokeless tobacco: Current User  Substance Use Topics  . Alcohol use: Yes    Alcohol/week: 1.0 - 4.0 standard drinks    Types: 1 - 4 Cans of beer per week   Objective:   BP 132/88 (BP Location: Left Arm, Patient Position: Sitting, Cuff Size: Normal)   Pulse 67   Temp 98.4 F (36.9 C) (Oral)   Wt 209 lb 3.2 oz (94.9 kg)   SpO2 99%   BMI 30.02 kg/m  Vitals:   06/08/18 1110  BP: 132/88  Pulse: 67  Temp: 98.4 F (36.9 C)  TempSrc: Oral  SpO2: 99%  Weight: 209 lb 3.2 oz (94.9 kg)     Physical Exam  Constitutional: He is oriented to person, place, and time. He appears well-developed and well-nourished. No distress.  HENT:  Head: Normocephalic and atraumatic.  Mouth/Throat: Oropharynx is clear and moist.  Eyes: Conjunctivae are normal. No  scleral icterus.  Neck: Neck supple. No thyromegaly present.  Cardiovascular: Normal rate, regular rhythm, normal heart sounds and intact distal pulses.  No murmur heard. Pulmonary/Chest: Effort normal and breath sounds normal. No respiratory distress. He has no wheezes. He has no rales.  Musculoskeletal: He exhibits no edema.  Lymphadenopathy:    He has no cervical adenopathy.  Neurological: He is alert and oriented to person, place, and time.  Skin: Skin is warm and dry. Capillary refill takes less than 2 seconds. No rash noted.  Psychiatric: He has a normal mood and affect. His speech is normal and behavior is normal. Thought content normal.  Vitals reviewed.       Assessment & Plan:   Problem List Items Addressed This Visit      Other   Anxiety - Primary    Currently well controlled Patient believes that stress  and triggers are significantly lessened We will agreed to try tapering back on Celexa Emphasized the importance of not taking Xanax regularly See AVS for Celexa taper      Relevant Medications   ALPRAZolam (XANAX) 1 MG tablet   Panic attacks    Infrequent and fairly well controlled We will taper back on Celexa as above Use Xanax very infrequently He has only used less than 15 pills in the last 6 months      Relevant Medications   ALPRAZolam (XANAX) 1 MG tablet       Return in about 2 months (around 08/09/2018).   The entirety of the information documented in the History of Present Illness, Review of Systems and Physical Exam were personally obtained by me. Portions of this information were initially documented by Presley RaddleNikki Walston, CMA and reviewed by me for thoroughness and accuracy.    Erasmo DownerBacigalupo, Angela M, MD, MPH [facility] 06/12/2018 8:24 AM

## 2018-06-12 NOTE — Assessment & Plan Note (Signed)
Infrequent and fairly well controlled We will taper back on Celexa as above Use Xanax very infrequently He has only used less than 15 pills in the last 6 months

## 2018-06-12 NOTE — Assessment & Plan Note (Signed)
Currently well controlled Patient believes that stress and triggers are significantly lessened We will agreed to try tapering back on Celexa Emphasized the importance of not taking Xanax regularly See AVS for Celexa taper

## 2018-06-19 ENCOUNTER — Other Ambulatory Visit: Payer: Self-pay | Admitting: Family Medicine

## 2018-06-29 ENCOUNTER — Telehealth: Payer: Self-pay | Admitting: Family Medicine

## 2018-06-29 NOTE — Telephone Encounter (Signed)
Pt wanting to speak with a nurse only.  Did not disclose reason for calling.  Please call pt back today, please.  Thanks, [employer]

## 2018-07-02 NOTE — Telephone Encounter (Signed)
Patient calling again wanting to speak with Dr. Leonard SchwartzB nurse. Please call patient. Contact number is correct.

## 2018-07-02 NOTE — Telephone Encounter (Signed)
Patient states that since he has been tapering off his Celexa he have became more depression and his anxiety has gotten worse. Patient wants to know is that normal and something that will go away. Patient would like to speak with you personally concerning this matter as well. Please advise.

## 2018-07-02 NOTE — Telephone Encounter (Signed)
Patient states that he would like to speak with you concerning the matter due to this issue has been going on since last week. I offered patient appointment ans he said that he could not wait until next week due to it been a ugrent matter. Patient states he do not know about getting back on the Celexa and wants to speak with you about the matter.

## 2018-07-02 NOTE — Telephone Encounter (Signed)
It is probably just that he actually needs the Celexa.  More likely to be a relapse, rather than side effect. Can see if he wants to restart it.

## 2018-07-03 NOTE — Telephone Encounter (Signed)
Spoke with patient.  He denies any SI or HI.  He is feeling more anxious.  His main concerning symptoms are dizziness, a vague symptom he cannot describe what he calls a brain shock, and fatigue.  We discussed that he is only been off the medication for a week and a half.  He will give this another week to see if this is from tapering off or if this is a relapse in symptoms.  He will call back next week to check in with how he is doing and we will consider whether we need to resume Celexa.  He is safe to himself currently.  Erasmo DownerBacigalupo, Katasha Riga M, MD, MPH [facility] 07/03/2018 11:54 AM

## 2018-07-03 NOTE — Telephone Encounter (Signed)
Attempted to call patient back.  LVM to call us back. No details left on voicemail.  Erasmo DownerBacigalupo, Vlad Mayberry M, MD, MPH [facility] 07/03/2018 9:01 AM

## 2018-07-25 ENCOUNTER — Encounter: Payer: 59 | Admitting: Family Medicine

## 2018-08-25 ENCOUNTER — Other Ambulatory Visit: Payer: Self-pay | Admitting: Family Medicine

## 2018-09-25 ENCOUNTER — Telehealth: Payer: Self-pay

## 2018-09-25 NOTE — Telephone Encounter (Signed)
Received a message from call center that patient called to request a refill on his inhaler. No name of medication. No medication or a diagnosis in patient's chart. Left message for patient to call back for more information.

## 2019-09-08 ENCOUNTER — Other Ambulatory Visit: Payer: Self-pay | Admitting: Family Medicine

## 2019-09-08 NOTE — Telephone Encounter (Signed)
Left a vm for patient to contact office to schedule follow up appointment. Will send a 30 day supply.

## 2019-10-02 ENCOUNTER — Other Ambulatory Visit: Payer: Self-pay | Admitting: Family Medicine

## 2019-10-02 MED ORDER — OMEPRAZOLE 20 MG PO CPDR: DELAYED_RELEASE_CAPSULE | ORAL | 0 refills | Status: DC

## 2019-10-02 NOTE — Addendum Note (Signed)
Addended by: Hyacinth Meeker on: 10/02/2019 02:53 PM   Modules accepted: Orders

## 2019-10-02 NOTE — Telephone Encounter (Signed)
Patient has schedule CPE 11/04/2019. Patient would like to know if you could send in a refill until his appointment.

## 2019-10-02 NOTE — Telephone Encounter (Signed)
Requested medications are due for refill today? Yes   Requested medications are on active medication list?  Yes  Last Refill:   09/08/2019   # 30 with no refills - This was a courtesy refill with a note indicating patient needed to make an appointment for an office visit.    Future visit scheduled?  NO  Notes to Clinic:  Medication failed protocol due to no office visit in one year.

## 2019-10-02 NOTE — Telephone Encounter (Signed)
OK to send in 1 month supply

## 2019-10-02 NOTE — Telephone Encounter (Signed)
Needs to schedule CPE or f/u visit before med refills are given.  Has been >1 yr since last appt

## 2019-11-01 ENCOUNTER — Other Ambulatory Visit: Payer: Self-pay | Admitting: Family Medicine

## 2019-11-04 ENCOUNTER — Encounter: Payer: Self-pay | Admitting: Family Medicine

## 2019-11-04 NOTE — Progress Notes (Deleted)
Complete physical exam   Patient: [name] [name]   DOB: Mar 01, 1986   34 y.o. Male  MRN: [id number] Visit Date: 11/04/2019  Today's healthcare provider: [name], MD   No chief complaint on file.  Subjective    [name] [name] is a 34 y.o. male who presents today for a complete physical exam.  He reports consuming a {diet types:17450} diet. {Exercise:19826} He generally feels {well/fairly well/poorly:18703}. He reports sleeping {well/fairly well/poorly:18703}. He {does/does not:200015} have additional problems to discuss today.  HPI  ***  Past Medical History:  Diagnosis Date  . Acid reflux   . Allergy   . Anxiety   . Heart murmur    Past Surgical History:  Procedure Laterality Date  . VASECTOMY     Social History   Socioeconomic History  . Marital status: Married    Spouse name: Not on file  . Number of children: 3  . Years of education: Not on file  . Highest education level: Not on file  Occupational History    Comment: city of [place]  Tobacco Use  . Smoking status: Never Smoker  . Smokeless tobacco: Current User  Substance and Sexual Activity  . Alcohol use: Yes    Alcohol/week: 1.0 - 4.0 standard drinks    Types: 1 - 4 Cans of beer per week  . Drug use: No  . Sexual activity: Yes    Partners: Female    Birth control/protection: Surgical  Other Topics Concern  . Not on file  Social History Narrative  . Not on file   Social Determinants of Health   Financial Resource Strain:   . Difficulty of Paying Living Expenses:   Food Insecurity:   . Worried About Charity fundraiser in the Last Year:   . Arboriculturist in the Last Year:   Transportation Needs:   . Film/video editor (Medical):   [name] Lack of Transportation (Non-Medical):   Physical Activity:   . Days of Exercise per Week:   . Minutes of Exercise per Session:   Stress:   . Feeling of Stress :   Social Connections:   . Frequency of Communication with Friends and Family:     . Frequency of Social Gatherings with Friends and Family:   . Attends Religious Services:   . Active Member of Clubs or Organizations:   . Attends Archivist Meetings:   [name] Marital Status:   Intimate Partner Violence:   . Fear of Current or Ex-Partner:   . Emotionally Abused:   [name] Physically Abused:   . Sexually Abused:    Family Status  Relation Name Status  . Mother  Alive  . Father  Alive  . Sister  (Not Specified)  . PGM  Deceased  . PGF  Alive  . Neg Hx  (Not Specified)   Family History  Problem Relation Age of Onset  . Thyroid disease Mother   . Anxiety disorder Mother   . Diabetes Father        pre-diabetes  . Hypothyroidism Sister   . Cancer Paternal Grandmother        thinks it was liver and pancreatic  . Diabetes Paternal Grandfather   . Prostate cancer Neg Hx   . Kidney cancer Neg Hx   . Bladder Cancer Neg Hx   . Breast cancer Neg Hx   . Colon cancer Neg Hx    Allergies  Allergen Reactions  . Erythromycin Rash  Patient Care Team: Virginia Crews, MD as PCP - General (Family Medicine)   Medications: Outpatient Medications Prior to Visit  Medication Sig  . ALPRAZolam (XANAX) 1 MG tablet Take 0.5-1 tablets (0.5-1 mg total) by mouth daily as needed for anxiety.  . cetirizine-pseudoephedrine (ZYRTEC-D) 5-120 MG tablet Take 1 tablet by mouth 2 (two) times daily.  [name] omeprazole (PRILOSEC) 20 MG capsule TAKE 1 CAPSULE BY MOUTH EVERY DAY   No facility-administered medications prior to visit.    Review of Systems  Constitutional: Negative.   HENT: Negative.   Eyes: Negative.   Respiratory: Negative.   Cardiovascular: Negative.   Gastrointestinal: Negative.   Endocrine: Negative.   Genitourinary: Negative.   Musculoskeletal: Negative.   Skin: Negative.   Allergic/Immunologic: Negative.   Neurological: Negative.   Hematological: Negative.   Psychiatric/Behavioral: Negative.     {Show previous labs (optional):23779::" "}  Objective     There were no vitals taken for this visit. {Show previous vital signs (optional):23777::" "}  Physical Exam  ***  Depression Screen  PHQ 2/9 Scores 11/20/2018 09/18/2017 08/18/2017  PHQ - 2 Score 0 0 2  PHQ- 9 Score - 3 8    No results found for any visits on 11/04/19.  Assessment & Plan    Routine Health Maintenance and Physical Exam  Exercise Activities and Dietary recommendations Goals   None     Immunization History  Administered Date(s) Administered  . DTaP 02/01/1988, 04/08/1988, 06/30/1988, 10/13/1989, 09/25/1992  . Hepatitis B 04/12/1999, 05/17/1999, 10/11/1999  . IPV 02/01/1988, 04/08/1988, 06/30/1988, 10/13/1989, 09/25/1992  . MMR 03/10/1989, 09/25/1992  . Tdap 10/28/2010    Health Maintenance  Topic Date Due  . HIV Screening  Never done  . COVID-19 Vaccine (1) Never done  . INFLUENZA VACCINE  02/02/2020  . TETANUS/TDAP  10/27/2020    Discussed health benefits of physical activity, and encouraged him to engage in regular exercise appropriate for his age and condition.  ***  No follow-ups on file.     {provider attestation***:1}   [name], MD  [facility] [phone] (phone) [phone] (fax)  [place]

## 2019-12-07 ENCOUNTER — Other Ambulatory Visit: Payer: Self-pay | Admitting: Family Medicine

## 2019-12-07 NOTE — Telephone Encounter (Signed)
Requested medication (s) are due for refill today: yes  Requested medication (s) are on the active medication list: yes  Last refill:  11/01/19  Future visit scheduled: no  Notes to clinic:  Pt overdue for appt- called pt and LM on VM to call office Monday to schedule appt- courtesy RF previously given   Requested Prescriptions  Pending Prescriptions Disp Refills   omeprazole (PRILOSEC) 20 MG capsule [Pharmacy Med Name: OMEPRAZOLE DR 20 MG CAPSULE] 30 capsule 0    Sig: TAKE 1 CAPSULE BY MOUTH EVERY DAY      Gastroenterology: Proton Pump Inhibitors Failed - 12/07/2019  9:43 AM      Failed - Valid encounter within last 12 months    Recent Outpatient Visits           1 year ago Anxiety   [facility] [place], Marzella Schlein, MD   1 year ago Anxiety   [facility] [place], Marzella Schlein, MD   2 years ago Anxiety   [facility] [place], Marzella Schlein, MD   2 years ago Anxiety   [facility] [place], Marzella Schlein, MD   2 years ago Sinus pressure   [facility] [place], Marzella Schlein, MD

## 2019-12-12 ENCOUNTER — Other Ambulatory Visit: Payer: Self-pay | Admitting: Family Medicine

## 2019-12-12 NOTE — Telephone Encounter (Signed)
Courtesy refill given until scheduled appt on 12/26/19

## 2019-12-12 NOTE — Telephone Encounter (Signed)
Copied from CRM [id number]. Topic: Quick Communication - Rx Refill/Question >> Dec 12, 2019  9:08 AM Marylen Ponto wrote: Medication: omeprazole (PRILOSEC) 20 MG capsule  Has the patient contacted their pharmacy? yes   Preferred Pharmacy (with phone number or street name): [id number] [address]  Phone: [phone]   Fax: [phone]  Agent: Please be advised that RX refills may take up to 3 business days. We ask that you follow-up with your pharmacy.

## 2019-12-26 ENCOUNTER — Ambulatory Visit: Payer: 59 | Admitting: Family Medicine

## 2019-12-30 ENCOUNTER — Ambulatory Visit: Payer: 59 | Admitting: Family Medicine

## 2019-12-30 ENCOUNTER — Other Ambulatory Visit: Payer: Self-pay | Admitting: Family Medicine

## 2019-12-30 MED ORDER — OMEPRAZOLE 20 MG PO CPDR: 20.0000 mg | DELAYED_RELEASE_CAPSULE | Freq: Every day | ORAL | 0 refills | Status: DC

## 2019-12-30 NOTE — Telephone Encounter (Signed)
Patient came into office for visit.  He is needing his omeprazole (PRILOSEC) 20 MG capsule refilled.  This was what the visit was for.  Pt wants to go ahead with getting his medication filled. Please call pt back to let him know if this can be done asap.  Thanks, [employer]

## 2020-02-13 ENCOUNTER — Ambulatory Visit (INDEPENDENT_AMBULATORY_CARE_PROVIDER_SITE_OTHER): Payer: 59 | Admitting: Physician Assistant

## 2020-02-13 ENCOUNTER — Other Ambulatory Visit: Payer: Self-pay

## 2020-02-13 ENCOUNTER — Encounter: Payer: Self-pay | Admitting: Physician Assistant

## 2020-02-13 VITALS — BP 128/82 | HR 108 | Temp 98.6°F | Ht 71.0 in | Wt 207.6 lb

## 2020-02-13 DIAGNOSIS — J302 Other seasonal allergic rhinitis: Secondary | ICD-10-CM | POA: Diagnosis not present

## 2020-02-13 DIAGNOSIS — K219 Gastro-esophageal reflux disease without esophagitis: Secondary | ICD-10-CM | POA: Diagnosis not present

## 2020-02-13 DIAGNOSIS — R002 Palpitations: Secondary | ICD-10-CM | POA: Diagnosis not present

## 2020-02-13 DIAGNOSIS — R0789 Other chest pain: Secondary | ICD-10-CM

## 2020-02-13 DIAGNOSIS — F419 Anxiety disorder, unspecified: Secondary | ICD-10-CM

## 2020-02-13 DIAGNOSIS — Z0000 Encounter for general adult medical examination without abnormal findings: Secondary | ICD-10-CM | POA: Diagnosis not present

## 2020-02-13 MED ORDER — CETIRIZINE-PSEUDOEPHEDRINE ER 5-120 MG PO TB12: 1.0000 | ORAL_TABLET | Freq: Every day | ORAL | 0 refills | Status: DC

## 2020-02-13 MED ORDER — OMEPRAZOLE 20 MG PO CPDR: 20.0000 mg | DELAYED_RELEASE_CAPSULE | Freq: Every day | ORAL | 3 refills | Status: DC

## 2020-02-13 NOTE — Progress Notes (Signed)
Complete physical exam   Patient: [name] [name]   DOB: Jan 26, 1986   34 y.o. Male  MRN: [id number] Visit Date: 02/13/2020  Today's healthcare provider: Trinna Post, PA-C   Chief Complaint  Patient presents with  . Annual Exam  . Chest Pain  I,Jeramyah Goodpasture M Clairissa Valvano,acting as a scribe for Trinna Post, PA-C.,have documented all relevant documentation on the behalf of Trinna Post, PA-C,as directed by  Trinna Post, PA-C while in the presence of Trinna Post, PA-C.  Subjective    [name] [name] is a 34 y.o. male who presents today for a complete physical exam.  He reports consuming a low fat, low sodium and high protein diet. Gym/ health club routine includes cardio, mod to heavy weightlifting and treadmill. He generally feels well. He reports sleeping well. He does have additional problems to discuss today.  HPI   Chest Pressure Patient reports he has had some chest pressure/ pain. He reports that he noticed his resting heart rate be elevated lately. He reports just sitting and noticing his HR in the 120's. He did not pass out, have a change in vision, or have shortness of breath. He believes his mother has a heart murmur but denies MI in the family. He is not using drugs. He does not drink caffeine. He switches between Zyrtec D and plain Zyrtec. Denies radiation of pain or vomiting. This issue has been happening for over a year and will happen approximately one time per week.   Anxiety He reports that he noticed anxiety attack recently and he had to take a Xanax. He was previously treated with Celexa but said he did not tolerate this. Reports anxiety is worsening especially now sending his child to kindergarten. He declines counseling. He declines daily anxiety medication.    Anxiety, Follow-up  He was last seen for anxiety 2 years ago. Changes made at last visit include tapering celexa 40 mg daily.    He feels his anxiety is moderate and Worse since last  visit.  Symptoms: Yes chest pain No difficulty concentrating  No dizziness No fatigue  No feelings of losing control No insomnia  No irritable Yes palpitations  Yes panic attacks No racing thoughts  Yes shortness of breath No sweating  No tremors/shakes    GAD-7 Results GAD-7 Generalized Anxiety Disorder Screening Tool 06/08/2018 09/18/2017 08/18/2017  1. Feeling Nervous, Anxious, or on Edge '1 1 3  '$ 2. Not Being Able to Stop or Control Worrying '1 1 2  '$ 3. Worrying Too Much About Different Things '1 1 2  '$ 4. Trouble Relaxing 1 0 1  5. Being So Restless it's Hard To Sit Still 1 0 1  6. Becoming Easily Annoyed or Irritable '1 1 1  '$ 7. Feeling Afraid As If Something Awful Might Happen 0 1 1  Total GAD-7 Score '6 5 11  '$ Difficulty At Work, Home, or Getting  Along With Others? Not difficult at all - Not difficult at all    PHQ-9 Scores PHQ9 SCORE ONLY 02/13/2020 11/20/2018 09/18/2017  PHQ-9 Total Score 1 0 3    ---------------------------------------------------------------------------------------------------   Past Medical History:  Diagnosis Date  . Acid reflux   . Allergy   . Anxiety   . Heart murmur    Past Surgical History:  Procedure Laterality Date  . VASECTOMY     Social History   Socioeconomic History  . Marital status: Married    Spouse name: Not on file  . Number  of children: 3  . Years of education: Not on file  . Highest education level: Not on file  Occupational History    Comment: [employer] [place]  Tobacco Use  . Smoking status: Never Smoker  . Smokeless tobacco: Current User  Vaping Use  . Vaping Use: Never used  Substance and Sexual Activity  . Alcohol use: Yes    Alcohol/week: 1.0 - 4.0 standard drink    Types: 1 - 4 Cans of beer per week  . Drug use: No  . Sexual activity: Yes    Partners: Female    Birth control/protection: Surgical  Other Topics Concern  . Not on file  Social History Narrative  . Not on file   Social Determinants of Health    Financial Resource Strain:   . Difficulty of Paying Living Expenses:   Food Insecurity:   . Worried About Charity fundraiser in the Last Year:   . Arboriculturist in the Last Year:   Transportation Needs:   . Film/video editor (Medical):   [name] Lack of Transportation (Non-Medical):   Physical Activity:   . Days of Exercise per Week:   . Minutes of Exercise per Session:   Stress:   . Feeling of Stress :   Social Connections:   . Frequency of Communication with Friends and Family:   . Frequency of Social Gatherings with Friends and Family:   . Attends Religious Services:   . Active Member of Clubs or Organizations:   . Attends Archivist Meetings:   [name] Marital Status:   Intimate Partner Violence:   . Fear of Current or Ex-Partner:   . Emotionally Abused:   [name] Physically Abused:   . Sexually Abused:    Family Status  Relation Name Status  . Mother  Alive  . Father  Alive  . Sister  (Not Specified)  . PGM  Deceased  . PGF  Alive  . Neg Hx  (Not Specified)   Family History  Problem Relation Age of Onset  . Thyroid disease Mother   . Anxiety disorder Mother   . Diabetes Father        pre-diabetes  . Hypothyroidism Sister   . Cancer Paternal Grandmother        thinks it was liver and pancreatic  . Diabetes Paternal Grandfather   . Prostate cancer Neg Hx   . Kidney cancer Neg Hx   . Bladder Cancer Neg Hx   . Breast cancer Neg Hx   . Colon cancer Neg Hx    Allergies  Allergen Reactions  . Erythromycin Rash    Patient Care Team: Virginia Crews, MD as PCP - General (Family Medicine)   Medications: Outpatient Medications Prior to Visit  Medication Sig  . [DISCONTINUED] ALPRAZolam (XANAX) 1 MG tablet Take 0.5-1 tablets (0.5-1 mg total) by mouth daily as needed for anxiety.  . [DISCONTINUED] cetirizine-pseudoephedrine (ZYRTEC-D) 5-120 MG tablet Take 1 tablet by mouth 2 (two) times daily.  . [DISCONTINUED] omeprazole (PRILOSEC) 20 MG capsule Take 1  capsule (20 mg total) by mouth daily.   No facility-administered medications prior to visit.    Review of Systems  Constitutional: Negative.   HENT: Negative.   Eyes: Negative.   Respiratory: Negative.   Cardiovascular: Negative.   Gastrointestinal: Negative.   Endocrine: Negative.   Genitourinary: Negative.   Musculoskeletal: Negative.   Skin: Negative.   Allergic/Immunologic: Negative.   Neurological: Negative.   Hematological: Negative.  Psychiatric/Behavioral: Negative.        Objective    BP 128/82 (BP Location: Left Arm, Patient Position: Sitting, Cuff Size: Normal)   Pulse (!) 108   Temp 98.6 F (37 C) (Oral)   Ht '5\' 11"'$  (1.803 m)   Wt 207 lb 9.6 oz (94.2 kg)   SpO2 97%   BMI 28.95 kg/m     Physical Exam Constitutional:      Appearance: Normal appearance.  HENT:     Right Ear: Tympanic membrane, ear canal and external ear normal.     Left Ear: Tympanic membrane, ear canal and external ear normal.  Cardiovascular:     Rate and Rhythm: Normal rate and regular rhythm.     Pulses: Normal pulses.     Heart sounds: Normal heart sounds.  Pulmonary:     Effort: Pulmonary effort is normal.     Breath sounds: Normal breath sounds.  Abdominal:     General: Abdomen is flat. Bowel sounds are normal.     Palpations: Abdomen is soft.  Skin:    General: Skin is warm and dry.  Neurological:     General: No focal deficit present.     Mental Status: He is alert and oriented to person, place, and time.  Psychiatric:        Mood and Affect: Mood normal.        Behavior: Behavior normal.       Last depression screening scores PHQ 2/9 Scores 02/13/2020 11/20/2018 09/18/2017  PHQ - 2 Score 0 0 0  PHQ- 9 Score 1 - 3   Last fall risk screening Fall Risk  02/13/2020  Falls in the past year? 0  Number falls in past yr: 0  Injury with Fall? 0   Last Audit-C alcohol use screening Alcohol Use Disorder Test (AUDIT) 02/13/2020  1. How often do you have a drink  containing alcohol? 0  2. How many drinks containing alcohol do you have on a typical day when you are drinking? 0  3. How often do you have six or more drinks on one occasion? 0  AUDIT-C Score 0   A score of 3 or more in women, and 4 or more in men indicates increased risk for alcohol abuse, EXCEPT if all of the points are from question 1   No results found for any visits on 02/13/20.  Assessment & Plan    Routine Health Maintenance and Physical Exam  Exercise Activities and Dietary recommendations Goals   None     Immunization History  Administered Date(s) Administered  . DTaP 02/01/1988, 04/08/1988, 06/30/1988, 10/13/1989, 09/25/1992  . Hepatitis B 04/12/1999, 05/17/1999, 10/11/1999  . IPV 02/01/1988, 04/08/1988, 06/30/1988, 10/13/1989, 09/25/1992  . MMR 03/10/1989, 09/25/1992  . Tdap 10/28/2010    Health Maintenance  Topic Date Due  . Hepatitis C Screening  Never done  . HIV Screening  Never done  . INFLUENZA VACCINE  02/02/2020  . COVID-19 Vaccine (1) 02/29/2020 (Originally 11/30/1999)  . TETANUS/TDAP  10/27/2020    Discussed health benefits of physical activity, and encouraged him to engage in regular exercise appropriate for his age and condition.  1. Annual physical exam  Refuses COVID vaccine.   - HIV Antibody (routine testing w rflx) - TSH - Lipid panel - Comprehensive metabolic panel - CBC with Differential/Platelet - Hepatitis C Antibody  2. Pressure in chest  EKG normal today, however due to chronicity and frequency will refer to cardiology. Recommend not using Zyrtec D, avoiding caffeine.   -  EKG 12-Lead - Ambulatory referral to Cardiology  3. Palpitations  - Ambulatory referral to Cardiology  4. Gastroesophageal reflux disease, unspecified whether esophagitis present  - omeprazole (PRILOSEC) 20 MG capsule; Take 1 capsule (20 mg total) by mouth daily.  Dispense: 90 capsule; Refill: 3  5. Seasonal allergic rhinitis, unspecified trigger  -  cetirizine-pseudoephedrine (ZYRTEC-D) 5-120 MG tablet; Take 1 tablet by mouth daily.  Dispense: 90 tablet; Refill: 0  6. Anxiety  Worsening. He declines counseling. He declines daily anxiety medication. He requests refill on Xanax. Explained this medication is not appropriate for chronic anxiety. I explained if he has anxiety continuing past this point he will need to be re-evaluated for counseling, daily SSRI and transition to hydroxyzine. I will give him 5 tabs of Xanax to be used sparingly.   - ALPRAZolam (XANAX) 0.5 MG tablet; Take 1 tablet (0.5 mg total) by mouth daily as needed.  Dispense: 5 tablet; Refill: 0    Return in about 1 year (around 02/12/2021).     ITrinna Post, PA-C, have reviewed all documentation for this visit. The documentation on 02/14/20 for the exam, diagnosis, procedures, and orders are all accurate and complete.  The entirety of the information documented in the History of Present Illness, Review of Systems and Physical Exam were personally obtained by me. Portions of this information were initially documented by Wilburt Finlay, CMA and reviewed by me for thoroughness and accuracy.         Paulene Floor  [facility] [phone] (phone) [phone] (fax)  [place]

## 2020-02-14 MED ORDER — ALPRAZOLAM 0.5 MG PO TABS: 0.5000 mg | ORAL_TABLET | Freq: Every day | ORAL | 0 refills | Status: DC | PRN

## 2020-02-15 LAB — CBC WITH DIFFERENTIAL/PLATELET
Basophils Absolute: 0 10*3/uL (ref 0.0–0.2)
Basos: 1 %
EOS (ABSOLUTE): 0.1 10*3/uL (ref 0.0–0.4)
Eos: 2 %
Hematocrit: 44.6 % (ref 37.5–51.0)
Hemoglobin: 15.5 g/dL (ref 13.0–17.7)
Immature Grans (Abs): 0 10*3/uL (ref 0.0–0.1)
Immature Granulocytes: 0 %
Lymphocytes Absolute: 2.4 10*3/uL (ref 0.7–3.1)
Lymphs: 39 %
MCH: 30.8 pg (ref 26.6–33.0)
MCHC: 34.8 g/dL (ref 31.5–35.7)
MCV: 89 fL (ref 79–97)
Monocytes Absolute: 0.5 10*3/uL (ref 0.1–0.9)
Monocytes: 8 %
Neutrophils Absolute: 3.2 10*3/uL (ref 1.4–7.0)
Neutrophils: 50 %
Platelets: 190 10*3/uL (ref 150–450)
RBC: 5.04 x10E6/uL (ref 4.14–5.80)
RDW: 12.3 % (ref 11.6–15.4)
WBC: 6.2 10*3/uL (ref 3.4–10.8)

## 2020-02-15 LAB — COMPREHENSIVE METABOLIC PANEL
ALT: 24 IU/L (ref 0–44)
AST: 20 IU/L (ref 0–40)
Albumin/Globulin Ratio: 1.9 (ref 1.2–2.2)
Albumin: 4.8 g/dL (ref 4.0–5.0)
Alkaline Phosphatase: 92 IU/L (ref 48–121)
BUN/Creatinine Ratio: 12 (ref 9–20)
BUN: 16 mg/dL (ref 6–20)
Bilirubin Total: 0.4 mg/dL (ref 0.0–1.2)
CO2: 22 mmol/L (ref 20–29)
Calcium: 9.8 mg/dL (ref 8.7–10.2)
Chloride: 103 mmol/L (ref 96–106)
Creatinine, Ser: 1.29 mg/dL — ABNORMAL HIGH (ref 0.76–1.27)
GFR calc Af Amer: 84 mL/min/{1.73_m2} (ref >59)
GFR calc non Af Amer: 73 mL/min/{1.73_m2} (ref >59)
Globulin, Total: 2.5 g/dL (ref 1.5–4.5)
Glucose: 98 mg/dL (ref 65–99)
Potassium: 4.6 mmol/L (ref 3.5–5.2)
Sodium: 141 mmol/L (ref 134–144)
Total Protein: 7.3 g/dL (ref 6.0–8.5)

## 2020-02-15 LAB — LIPID PANEL
Chol/HDL Ratio: 6.1 ratio — ABNORMAL HIGH (ref 0.0–5.0)
Cholesterol, Total: 232 mg/dL — ABNORMAL HIGH (ref 100–199)
HDL: 38 mg/dL — ABNORMAL LOW (ref >39)
LDL Chol Calc (NIH): 148 mg/dL — ABNORMAL HIGH (ref 0–99)
Triglycerides: 250 mg/dL — ABNORMAL HIGH (ref 0–149)
VLDL Cholesterol Cal: 46 mg/dL — ABNORMAL HIGH (ref 5–40)

## 2020-02-15 LAB — HIV ANTIBODY (ROUTINE TESTING W REFLEX): HIV Screen 4th Generation wRfx: NONREACTIVE

## 2020-02-15 LAB — HEPATITIS C ANTIBODY: Hep C Virus Ab: <0.1 s/co ratio (ref 0.0–0.9)

## 2020-02-15 LAB — TSH: TSH: 2.1 u[IU]/mL (ref 0.450–4.500)

## 2020-03-03 ENCOUNTER — Telehealth: Payer: Self-pay

## 2020-03-03 NOTE — Telephone Encounter (Signed)
Copied from CRM [id number]. Topic: General - Call Back - No Documentation >> Mar 03, 2020  3:31 PM Randol Kern wrote: Pt is experiencing symptoms related to recent visit, please advise. Pt would like a call back from PCP or nurse. Declined to share any further info  Best contact: [phone]

## 2020-03-03 NOTE — Telephone Encounter (Signed)
Spoke with patient and he would like to do a mychart visit with you to discuss issues. You dont have anything available. Are you willing to work the patient in? Please advise. Thanks!

## 2020-03-03 NOTE — Telephone Encounter (Signed)
Spoke with patient and he is still having anxiety and chest discomfort. He reports that his symptoms are intermittent and can last anywhere from a few mins to hours. He feels light headed at times. He is also concerned about his cholesterol being elevated, and feels that this needs to be treated. He reports that his cardiology appt was rescheduled to 9/7 but feels something needs to be done now. I advised the patient to hold on while I get the provider for her recommendation, and he hung up. Tried calling patient and no answer. Will try again to see if he would like to schedule an appt to come in to discuss issues with provider.

## 2020-03-03 NOTE — Telephone Encounter (Signed)
Pt called back after 5:00 pm, reporting that he was on the phone with a nurse but was disconnected. Please advise, pt would like another call back

## 2020-03-04 ENCOUNTER — Emergency Department (HOSPITAL_COMMUNITY)
Admission: EM | Admit: 2020-03-04 | Discharge: 2020-03-04 | Disposition: A | Discharge status: Home / Self Care | Payer: 59 | Attending: Emergency Medicine | Admitting: Emergency Medicine

## 2020-03-04 ENCOUNTER — Encounter (HOSPITAL_COMMUNITY): Payer: Self-pay | Admitting: Emergency Medicine

## 2020-03-04 ENCOUNTER — Emergency Department (HOSPITAL_COMMUNITY): Payer: 59

## 2020-03-04 DIAGNOSIS — R0789 Other chest pain: Secondary | ICD-10-CM | POA: Diagnosis present

## 2020-03-04 DIAGNOSIS — Z79899 Other long term (current) drug therapy: Secondary | ICD-10-CM | POA: Diagnosis not present

## 2020-03-04 LAB — CBC
HCT: 47.7 % (ref 39.0–52.0)
Hemoglobin: 15.9 g/dL (ref 13.0–17.0)
MCH: 29.6 pg (ref 26.0–34.0)
MCHC: 33.3 g/dL (ref 30.0–36.0)
MCV: 88.7 fL (ref 80.0–100.0)
Platelets: 188 10*3/uL (ref 150–400)
RBC: 5.38 MIL/uL (ref 4.22–5.81)
RDW: 11.9 % (ref 11.5–15.5)
WBC: 6.6 10*3/uL (ref 4.0–10.5)
nRBC: 0 % (ref 0.0–0.2)

## 2020-03-04 LAB — BASIC METABOLIC PANEL
Anion gap: 11 (ref 5–15)
BUN: 12 mg/dL (ref 6–20)
CO2: 26 mmol/L (ref 22–32)
Calcium: 9.8 mg/dL (ref 8.9–10.3)
Chloride: 101 mmol/L (ref 98–111)
Creatinine, Ser: 1.48 mg/dL — ABNORMAL HIGH (ref 0.61–1.24)
GFR calc Af Amer: >60 mL/min (ref >60)
GFR calc non Af Amer: >60 mL/min (ref >60)
Glucose, Bld: 99 mg/dL (ref 70–99)
Potassium: 4.1 mmol/L (ref 3.5–5.1)
Sodium: 138 mmol/L (ref 135–145)

## 2020-03-04 LAB — TROPONIN I (HIGH SENSITIVITY)
Troponin I (High Sensitivity): <2 ng/L (ref <18)
Troponin I (High Sensitivity): <2 ng/L (ref <18)

## 2020-03-04 NOTE — Telephone Encounter (Signed)
Can he get on soon (by 1040) for a mychart appt?  We are a bit ahead of schedule currently

## 2020-03-04 NOTE — Telephone Encounter (Signed)
I am booked solid. Can we see if anyone else has availability?  His last appointment for this was actually with Adriana.

## 2020-03-04 NOTE — ED Provider Notes (Signed)
[facility] EMERGENCY DEPARTMENT Provider Note   CSN: [id number] Arrival date & time: 03/04/20  1015     History Chief Complaint  Patient presents with  . Chest Pain    [name] [name] is a 34 y.o. male.  The history is provided by the patient and medical records. No language interpreter was used.  Chest Pain Pain location:  L chest Pain quality: aching and tightness   Pain radiates to:  Mid back Pain severity:  Moderate Onset quality:  Gradual Timing:  Intermittent Chronicity:  Recurrent Context: at rest   Context: not breathing, not drug use, not eating, not intercourse, not lifting, not movement, not raising an arm and not stress   Relieved by: Improves with running and excercise, worse at rest. Exacerbated by: rest. Ineffective treatments:  None tried Associated symptoms: anxiety   Associated symptoms: no abdominal pain, no AICD problem, no altered mental status, no anorexia, no back pain, no claudication, no cough, no diaphoresis, no dizziness, no dysphagia, no fatigue, no fever and no headache   Risk factors: male sex   Risk factors: no aortic disease, no coronary artery disease, no diabetes mellitus, no Ehlers-Danlos syndrome, no high cholesterol, no hypertension, no immobilization, no Marfan's syndrome, not obese, no prior DVT/PE, no smoking and no surgery     HPI: A 34 year old patient presents for evaluation of chest pain. Initial onset of pain was less than one hour ago. The patient's chest pain is described as heaviness/pressure/tightness and is not worse with exertion. The patient's chest pain is middle- or left-sided, is not well-localized, is not sharp and does not radiate to the arms/jaw/neck. The patient does not complain of nausea and denies diaphoresis. The patient has no history of stroke, has no history of peripheral artery disease, has not smoked in the past 90 days, denies any history of treated diabetes, has no relevant family history of  coronary artery disease (first degree relative at less than age 89), is not hypertensive, has no history of hypercholesterolemia and does not have an elevated BMI (>=30).   Past Medical History:  Diagnosis Date  . Acid reflux   . Allergy   . Anxiety   . Heart murmur     Patient Active Problem List   Diagnosis Date Noted  . Non-restorative sleep 12/20/2017  . Migraine with aura 12/20/2017  . Fatigue 09/19/2017  . Panic attacks 08/18/2017  . GERD (gastroesophageal reflux disease) 06/01/2017  . Anxiety 06/01/2017  . Allergic rhinitis 06/01/2017    Past Surgical History:  Procedure Laterality Date  . VASECTOMY         Family History  Problem Relation Age of Onset  . Thyroid disease Mother   . Anxiety disorder Mother   . Diabetes Father        pre-diabetes  . Hypothyroidism Sister   . Cancer Paternal Grandmother        thinks it was liver and pancreatic  . Diabetes Paternal Grandfather   . Prostate cancer Neg Hx   . Kidney cancer Neg Hx   . Bladder Cancer Neg Hx   . Breast cancer Neg Hx   . Colon cancer Neg Hx     Social History   Tobacco Use  . Smoking status: Never Smoker  . Smokeless tobacco: Current User  Vaping Use  . Vaping Use: Never used  Substance Use Topics  . Alcohol use: Yes    Alcohol/week: 1.0 - 4.0 standard drink    Types: 1 -  4 Cans of beer per week  . Drug use: No    Home Medications Prior to Admission medications   Medication Sig Start Date End Date Taking? Authorizing Provider  ALPRAZolam Prudy Feeler) 0.5 MG tablet Take 1 tablet (0.5 mg total) by mouth daily as needed. 02/14/20   Trey Sailors, PA-C  cetirizine-pseudoephedrine (ZYRTEC-D) 5-120 MG tablet Take 1 tablet by mouth daily. 02/13/20   Trey Sailors, PA-C  omeprazole (PRILOSEC) 20 MG capsule Take 1 capsule (20 mg total) by mouth daily. 02/13/20   Trey Sailors, PA-C    Allergies    Erythromycin  Review of Systems   Review of Systems  Constitutional: Negative for  diaphoresis, fatigue and fever.  HENT: Negative for trouble swallowing.   Respiratory: Negative for cough.   Cardiovascular: Positive for chest pain. Negative for claudication.  Gastrointestinal: Negative for abdominal pain and anorexia.  Musculoskeletal: Negative for back pain.  Neurological: Negative for dizziness and headaches.    Physical Exam Updated Vital Signs BP 126/78   Pulse 88   Temp 98.3 F (36.8 C)   Resp 15   SpO2 99%   Physical Exam Vitals and nursing note reviewed.  Constitutional:      General: He is not in acute distress.    Appearance: He is well-developed. He is not diaphoretic.  HENT:     Head: Normocephalic and atraumatic.  Eyes:     General: No scleral icterus.    Conjunctiva/sclera: Conjunctivae normal.  Cardiovascular:     Rate and Rhythm: Normal rate and regular rhythm.     Heart sounds: Normal heart sounds.  Pulmonary:     Effort: Pulmonary effort is normal. No respiratory distress.     Breath sounds: Normal breath sounds.  Abdominal:     Palpations: Abdomen is soft.     Tenderness: There is no abdominal tenderness.  Musculoskeletal:     Cervical back: Normal range of motion and neck supple.  Skin:    General: Skin is warm and dry.  Neurological:     Mental Status: He is alert.  Psychiatric:        Mood and Affect: Mood is anxious.        Behavior: Behavior normal.     ED Results / Procedures / Treatments   Labs (all labs ordered are listed, but only abnormal results are displayed) Labs Reviewed  BASIC METABOLIC PANEL - Abnormal; Notable for the following components:      Result Value   Creatinine, Ser 1.48 (*)    All other components within normal limits  CBC  TROPONIN I (HIGH SENSITIVITY)  TROPONIN I (HIGH SENSITIVITY)    EKG EKG Interpretation  Date/Time:  Wednesday March 04 2020 10:22:37 EDT Ventricular Rate:  85 PR Interval:  134 QRS Duration: 88 QT Interval:  354 QTC Calculation: 421 R Axis:   44 Text  Interpretation: Normal sinus rhythm with sinus arrhythmia Normal ECG No STEMI Confirmed by Alvester Chou [phone]) on 03/04/2020 3:28:47 PM   Radiology DG Chest 2 View  Result Date: 03/04/2020 CLINICAL DATA:  Chest pain, shortness of breath EXAM: CHEST - 2 VIEW COMPARISON:  04/25/2017 FINDINGS: The heart size and mediastinal contours are within normal limits. Both lungs are clear. No effusions. No pneumothorax. The visualized skeletal structures are unremarkable. IMPRESSION: No acute cardiopulmonary disease. Electronically Signed   By: Feliberto Harts MD   On: 03/04/2020 10:48    Procedures Procedures (including critical care time)  Medications Ordered in ED Medications -  No data to display  ED Course  I have reviewed the triage vital signs and the nursing notes.  Pertinent labs & imaging results that were available during my care of the patient were reviewed by me and considered in my medical decision making (see chart for details).    MDM Rules/Calculators/A&P HEAR Score: 1                         Given the large differential diagnosis for [name] [name], the decision making in this case is of high complexity.  After evaluating all of the data points in this case, the presentation of [name] [name] is NOT consistent with Acute Coronary Syndrome (ACS) and/or myocardial ischemia, pulmonary embolism, aortic dissection; Borhaave's, significant arrythmia, pneumothorax, cardiac tamponade, or other emergent cardiopulmonary condition.  Further, the presentation of [name] [name] is NOT consistent with pericarditis, myocarditis, cholecystitis, pancreatitis, mediastinitis, endocarditis, new valvular disease.  Additionally, the presentation of [name] [name] NOT consistent with flail chest, cardiac contusion, ARDS, or significant intra-thoracic or intra-abdominal bleeding.  Moreover, this presentation is NOT consistent with pneumonia, sepsis, or pyelonephritis.  The patient has a    HEART  score of 1, is PERC negative.  EKG shows Sinus arrhythmia at a rate of 85. Troponin negative x2 CBC without abnormality. BMP with slightly elevated creatinine- recommend avoiding NSAIDS and retest with PCP. I reviewed images of the 2 v cxr which shows no abnormality   Strict return and follow-up precautions have been given by me personally or by detailed written instruction given verbally by nursing staff using the teach back method to the patient/family/caregiver(s).  Data Reviewed/Counseling: I have reviewed the patient's vital signs, nursing notes, and other relevant tests/information. I had a detailed discussion regarding the historical points, exam findings, and any diagnostic results supporting the discharge diagnosis. I also discussed the need for outpatient follow-up and the need to return to the ED if symptoms worsen or if there are any questions or concerns that arise at home.  Final Clinical Impression(s) / ED Diagnoses Final diagnoses:  Atypical chest pain    Rx / DC Orders ED Discharge Orders    None       Arthor Captain, PA-C 03/04/20 1815    Charlynne Pander, MD 03/04/20 2236

## 2020-03-04 NOTE — Discharge Instructions (Addendum)
Your creatinine was just above normal. This could be due to some dehydration. Make sure to drink plenty of fluids and avoid NSAIDS (motrin, aleve,aspirin).  The remainder of your labs and imaging are normal Your caregiver has diagnosed you as having chest pain that is not specific for one problem, but does not require admission.  You are at low risk for an acute heart condition or other serious illness. Chest pain comes from many different causes.  SEEK IMMEDIATE MEDICAL ATTENTION IF: You have severe chest pain, especially if the pain is crushing or pressure-like and spreads to the arms, back, neck, or jaw, or if you have sweating, nausea (feeling sick to your stomach), or shortness of breath. THIS IS AN EMERGENCY. Don't wait to see if the pain will go away. Get medical help at once. Call 911 or 0 (operator). DO NOT drive yourself to the hospital.  Your chest pain gets worse and does not go away with rest.  You have an attack of chest pain lasting longer than usual, despite rest and treatment with the medications your caregiver has prescribed.  You wake from sleep with chest pain or shortness of breath.  You feel dizzy or faint.  You have chest pain not typical of your usual pain for which you originally saw your caregiver.   **[employer] **[employer]

## 2020-03-04 NOTE — Telephone Encounter (Signed)
LMTCB 03/04/2020.  PEC please schedule a my chart visit if he calls back by 10:40   Thanks,   -Vernona Rieger

## 2020-03-04 NOTE — Telephone Encounter (Signed)
Patient refused to schedule appointment with any one other than Dr. Beryle Flock. Patient advised of Dr. Senaida Lange first available appointment on 03/11/2020, and reports that he will call back.

## 2020-03-04 NOTE — ED Triage Notes (Signed)
Pt states chest pain that has been ongoing for a while, saw pcp who did ekg and labs and referred him to cards but he has not been able to see them yet. States that over the last few days cp and sob have been worse, also endorses anxiety which he is now taking meds for, took last about 30 mins ago.

## 2020-03-04 NOTE — Telephone Encounter (Signed)
Pt went to the ER.

## 2020-03-10 ENCOUNTER — Other Ambulatory Visit: Payer: Self-pay

## 2020-03-10 ENCOUNTER — Ambulatory Visit: Payer: 59 | Admitting: Cardiovascular Disease

## 2020-03-10 ENCOUNTER — Encounter: Payer: Self-pay | Admitting: Cardiovascular Disease

## 2020-03-10 VITALS — BP 130/90 | HR 76 | Ht 71.0 in | Wt 204.5 lb

## 2020-03-10 DIAGNOSIS — R079 Chest pain, unspecified: Secondary | ICD-10-CM | POA: Diagnosis not present

## 2020-03-10 DIAGNOSIS — R0602 Shortness of breath: Secondary | ICD-10-CM | POA: Diagnosis not present

## 2020-03-10 NOTE — Progress Notes (Signed)
Cardiology Office Note   Date:  03/10/2020   ID:  [name] [name], DOB [birth year], MRN [id number]  PCP:  [name], MD  Cardiologist:   [name], MD   Chief Complaint  Patient presents with  . OTHER    Chest pressure/palpitations. Meds reviewed verbally with pt.      History of Present Illness: [name] [name] is a 34 y.o. male who was referred from [facility] ED for evaluation of chest pain and shortness of breath.  The patient has no prior cardiac history.  He has known history of hyperlipidemia and anxiety disorder.  He is not diabetic and does not smoke.  There is no family history of premature coronary artery disease or sudden death.  The patient exercises regularly and goes to the gym almost daily.  He runs of the treadmill for 1 mile and he does mostly weight lifting.  Recently, he had intermittent episodes of left-sided chest tightness radiating to his back and between his shoulder blades.  These episodes can happen at rest or with exertion.  In addition, he had episodes of intermittent shortness of breath and some tingling in his fingers.  He feels very anxious about the symptoms.  No significant orthopnea, PND or leg edema.  He went to the emergency room at [facility] recently for the symptoms.  Troponin was less than 2.  EKG showed no ischemic changes.  Chest x-ray was unremarkable.    Past Medical History:  Diagnosis Date  . Acid reflux   . Allergy   . Anxiety   . Heart murmur     Past Surgical History:  Procedure Laterality Date  . VASECTOMY       Current Outpatient Medications  Medication Sig Dispense Refill  . ALPRAZolam (XANAX) 0.5 MG tablet Take 1 tablet (0.5 mg total) by mouth daily as needed. 5 tablet 0  . cetirizine (ZYRTEC) 10 MG tablet Take 10 mg by mouth daily.    [name] omeprazole (PRILOSEC) 20 MG capsule Take 1 capsule (20 mg total) by mouth daily. 90 capsule 3  . Red Yeast Rice Extract (RED YEAST RICE PO) Take by mouth daily.     No current  facility-administered medications for this visit.    Allergies:   Erythromycin    Social History:  The patient  reports that he has never smoked. He uses smokeless tobacco. He reports current alcohol use of about 1.0 - 4.0 standard drink of alcohol per week. He reports that he does not use drugs.   Family History:  The patient's family history includes Anxiety disorder in his mother; Cancer in his paternal grandmother; Diabetes in his father and paternal grandfather; Heart Problems in his maternal grandmother; Heart murmur in his mother; Hypothyroidism in his sister; Thyroid disease in his mother.    ROS:  Please see the history of present illness.   Otherwise, review of systems are positive for none.   All other systems are reviewed and negative.    PHYSICAL EXAM: VS:  BP 130/90 (BP Location: Right Arm, Patient Position: Sitting, Cuff Size: Normal)   Pulse 76   Ht 5\' 11"  (1.803 m)   Wt 204 lb 8 oz (92.8 kg)   SpO2 98%   BMI 28.52 kg/m  , BMI Body mass index is 28.52 kg/m. GEN: Well nourished, well developed, in no acute distress  HEENT: normal  Neck: no JVD, carotid bruits, or masses Cardiac: RRR; no murmurs, rubs, or gallops,no edema  Respiratory:  clear  to auscultation bilaterally, normal work of breathing GI: soft, nontender, nondistended, + BS MS: no deformity or atrophy  Skin: warm and dry, no rash Neuro:  Strength and sensation are intact Psych: euthymic mood, full affect   EKG:  EKG is ordered today. The ekg ordered today demonstrates normal sinus rhythm with no significant ST or T wave changes.   Recent Labs: 02/14/2020: ALT 24; TSH 2.100 03/04/2020: BUN 12; Creatinine, Ser 1.48; Hemoglobin 15.9; Platelets 188; Potassium 4.1; Sodium 138    Lipid Panel    Component Value Date/Time   CHOL 232 (H) 02/14/2020 0804   TRIG 250 (H) 02/14/2020 0804   HDL 38 (L) 02/14/2020 0804   CHOLHDL 6.1 (H) 02/14/2020 0804   LDLCALC 148 (H) 02/14/2020 0804      Wt Readings  from Last 3 Encounters:  03/10/20 204 lb 8 oz (92.8 kg)  02/13/20 207 lb 9.6 oz (94.2 kg)  06/08/18 209 lb 3.2 oz (94.9 kg)        PAD Screen 03/10/2020  Previous PAD dx? No  Previous surgical procedure? No  Pain with walking? No  Feet/toe relief with dangling? No  Painful, non-healing ulcers? No  Extremities discolored? No      ASSESSMENT AND PLAN:  1.  Atypical chest pain: The patient risk factors include gender and hyperlipidemia: EKG does not show any ischemic changes.  Recommend evaluation with a treadmill stress test.  2.  Intermittent shortness of breath: I requested an echocardiogram to ensure no cardiomyopathy or valvular abnormalities.  3.  Anxiety: This might be contributing or causing all of his symptoms.  If cardiac work-up is unremarkable, recommend addressing this.    Disposition:   FU with me as needed.  Signed,  [name], MD  03/10/2020 2:08 PM    [facility]

## 2020-03-10 NOTE — Patient Instructions (Signed)
Medication Instructions:  Your physician recommends that you continue on your current medications as directed. Please refer to the Current Medication list given to you today.  *If you need a refill on your cardiac medications before your next appointment, please call your pharmacy*   Lab Work: None ordered If you have labs (blood work) drawn today and your tests are completely normal, you will receive your results only by: [name] MyChart Message (if you have MyChart) OR . A paper copy in the mail If you have any lab test that is abnormal or we need to change your treatment, we will call you to review the results.   Testing/Procedures: Your physician has requested that you have an echocardiogram. Echocardiography is a painless test that uses sound waves to create images of your heart. It provides your doctor with information about the size and shape of your heart and how well your heart's chambers and valves are working. This procedure takes approximately one hour. There are no restrictions for this procedure.  Your physician has requested that you have an exercise tolerance test. For further information please visit [email or web address] Please also follow instruction sheet, as given.     Follow-Up: At [facility], you and your health needs are our priority.  As part of our continuing mission to provide you with exceptional heart care, we have created designated Provider Care Teams.  These Care Teams include your primary Cardiologist (physician) and Advanced Practice Providers (APPs -  Physician Assistants and Nurse Practitioners) who all work together to provide you with the care you need, when you need it.  We recommend signing up for the patient portal called "MyChart".  Sign up information is provided on this After Visit Summary.  MyChart is used to connect with patients for Virtual Visits (Telemedicine).  Patients are able to view lab/test results, encounter notes, upcoming appointments,  etc.  Non-urgent messages can be sent to your provider as well.   To learn more about what you can do with MyChart, go to [email or web address].    Your next appointment:   As needed   The format for your next appointment:   In Person  Provider:    You may see Dr. [name] or one of the following Advanced Practice Providers on your designated Care Team:    Nicolasa Ducking, NP  Eula Listen, PA-C  Marisue Ivan, PA-C    Other Instructions N/A

## 2020-03-17 ENCOUNTER — Ambulatory Visit: Payer: Self-pay | Admitting: Cardiology

## 2020-03-25 ENCOUNTER — Other Ambulatory Visit: Admission: RE | Admit: 2020-03-25 | Payer: 59 | Source: Ambulatory Visit

## 2020-04-02 ENCOUNTER — Other Ambulatory Visit: Payer: Self-pay

## 2020-04-02 ENCOUNTER — Ambulatory Visit (INDEPENDENT_AMBULATORY_CARE_PROVIDER_SITE_OTHER): Payer: 59

## 2020-04-02 DIAGNOSIS — R0602 Shortness of breath: Secondary | ICD-10-CM | POA: Diagnosis not present

## 2020-04-03 DIAGNOSIS — Q231 Congenital insufficiency of aortic valve: Secondary | ICD-10-CM

## 2020-04-03 DIAGNOSIS — Q2381 Bicuspid aortic valve: Secondary | ICD-10-CM

## 2020-04-03 HISTORY — DX: Congenital insufficiency of aortic valve: Q23.1

## 2020-04-03 HISTORY — DX: Bicuspid aortic valve: Q23.81

## 2020-04-08 LAB — ECHOCARDIOGRAM COMPLETE
AR max vel: 2.58 cm2
AV Area VTI: 2.83 cm2
AV Area mean vel: 2.68 cm2
AV Mean grad: 5 mmHg
AV Peak grad: 10.1 mmHg
Ao pk vel: 1.59 m/s
Area-P 1/2: 3.23 cm2
Calc EF: 49.3 %
S' Lateral: 3.5 cm
Single Plane A2C EF: 46.1 %
Single Plane A4C EF: 49.8 %

## 2020-04-16 ENCOUNTER — Ambulatory Visit: Payer: 59 | Admitting: Family

## 2020-04-16 ENCOUNTER — Encounter: Payer: Self-pay | Admitting: Family

## 2020-04-16 ENCOUNTER — Other Ambulatory Visit: Payer: Self-pay

## 2020-04-16 VITALS — BP 122/78 | HR 70 | Ht 71.0 in | Wt 209.0 lb

## 2020-04-16 DIAGNOSIS — Q231 Congenital insufficiency of aortic valve: Secondary | ICD-10-CM

## 2020-04-16 DIAGNOSIS — R079 Chest pain, unspecified: Secondary | ICD-10-CM

## 2020-04-16 NOTE — Progress Notes (Signed)
Office Visit    Patient Name: [name] [name] Date of Encounter: 04/16/2020  Primary Care Provider:  [name], MD Primary Cardiologist:  [name], MD Electrophysiologist:  None   Chief Complaint    [name] [name] is a 34 y.o. male with a hx of  bicuspid aortic valve with mild regurgitation by echo 03/2020, HLD, anxiety, chest pain presents today for follow up after echocardiogram.   Past Medical History    Past Medical History:  Diagnosis Date  . Acid reflux   . Allergy   . Anxiety   . Heart murmur    Past Surgical History:  Procedure Laterality Date  . VASECTOMY      Allergies  Allergies  Allergen Reactions  . Erythromycin Rash    History of Present Illness    [name] [name] is a 34 y.o. male with a hx of bicuspid aortic valve with mild regurgitation by echo 03/2020, HLD, anxiety, chest pain last seen 03/10/20 by Dr. [name].  He was seen by Dr. [name] in consult after ED visit for chest pain and shortness of breath. No known cardiac history. He had intermittent episodes of left sided chest tightness radiating to back and between shoulder blades at rest and with activity. ED visit 03/04/20 with troponin <2, EKG with no ischemic change, CXR unremarkable. He was recommended for treadmill stress test as well as echocardiogram.   He had echocardiogram 04/02/20. Dr. [name] reviewed images and determined aortic valve is bicuspid and mildly leaky with no significant stenosis. LVEF 55-60%, no RWMA, no LVH. Recommended for repeat echocardiogram in 2 years.   Presents today for follow up. Pleasant gentleman who enjoys spending time with his children - his daughter is almost six and he has twin boys who are almost four. Long discussion regarding bicuspid aortic valve and typical progression. Tells me his grandparents have had valve replacements but they were elder when repaired. He is not sure if either had a bicuspid aortic valve.  Reports one episode of chest discomfort  which self-resolved after a few minutes. He continues to exercise daily doing high-intensity interval training with no dyspnea. He had to cancel his previously scheduled exercise tolerance test due to travel.   He reports no lightheadedness, near syncope, nor syncope. No shortness of breath. Dyspnea on exertion is intermittent and stable compared to his last office visit.   EKGs/Labs/Other Studies Reviewed:   The following studies were reviewed today: Echo 04/02/20  1. Left ventricular ejection fraction, by estimation, is 55 to 60%. The  left ventricle has normal function. The left ventricle has no regional  wall motion abnormalities. Left ventricular diastolic parameters were  normal.   2. Right ventricular systolic function is normal. The right ventricular  size is normal.   3. The mitral valve is normal in structure. No evidence of mitral valve  regurgitation. No evidence of mitral stenosis.   4. Bicuspid aortic valve type 1 (raphe/calcification between R-L cusp).  The aortic valve is bicuspid. Aortic valve regurgitation is mild. Mild  aortic valve sclerosis is present, with no evidence of aortic valve  stenosis.   5. The inferior vena cava is normal in size with greater than 50%  respiratory variability, suggesting right atrial pressure of 3 mmHg.   EKG:  EKG is ordered today.  The ekg ordered today demonstrates NSR 70 bpm with no acute ST/T wave changes.   Recent Labs: 02/14/2020: ALT 24; TSH 2.100 03/04/2020: BUN 12; Creatinine, Ser 1.48; Hemoglobin 15.9;  Platelets 188; Potassium 4.1; Sodium 138  Recent Lipid Panel    Component Value Date/Time   CHOL 232 (H) 02/14/2020 0804   TRIG 250 (H) 02/14/2020 0804   HDL 38 (L) 02/14/2020 0804   CHOLHDL 6.1 (H) 02/14/2020 0804   LDLCALC 148 (H) 02/14/2020 0804   Home Medications   Current Meds  Medication Sig  . ALPRAZolam (XANAX) 0.5 MG tablet Take 1 tablet (0.5 mg total) by mouth daily as needed.  . cetirizine (ZYRTEC) 10 MG tablet  Take 10 mg by mouth daily.  . NON FORMULARY [place]oli Gummies---Ashwaghanda: 2 gummies in the morning and 2 gummies in the evening  . omeprazole (PRILOSEC) 20 MG capsule Take 1 capsule (20 mg total) by mouth daily.  . Red Yeast Rice Extract (RED YEAST RICE PO) Take by mouth daily.    Review of Systems      All other systems reviewed and are otherwise negative except as noted above.  Physical Exam    VS:  BP 122/78   Pulse 70   Ht 5\' 11"  (1.803 m)   Wt 209 lb (94.8 kg)   BMI 29.15 kg/m  , BMI Body mass index is 29.15 kg/m.  Wt Readings from Last 3 Encounters:  04/16/20 209 lb (94.8 kg)  03/10/20 204 lb 8 oz (92.8 kg)  02/13/20 207 lb 9.6 oz (94.2 kg)    GEN: Well nourished, well developed, in no acute distress. HEENT: normal. Neck: Supple, no JVD, carotid bruits, or masses. Cardiac: RRR, no murmurs, rubs, or gallops. No clubbing, cyanosis, edema.  Radials/DP/PT 2+ and equal bilaterally.  Respiratory:  Respirations regular and unlabored, clear to auscultation bilaterally. GI: Soft, nontender, nondistended. MS: No deformity or atrophy. Skin: Warm and dry, no rash. Neuro:  Strength and sensation are intact. Psych: Normal affect.  Assessment & Plan    1. Bicuspid aortic valve - Noted by echo 04/02/20 with norma lLVEF, mild aortic regurgitation, and no aortic stenosis. Long discussion regarding typical progression and common need for valve replacement in future years. Plan for repeat echo for monitoring 04/2022. Encouraged to continue regular cardiovascular exercise and heart healthy diet.  2. Atypical chest pain - One episode since last seen. Encouraged to reschedule his exercise tolerance test which was previously recommended by Dr. 05/2022. EKG today no acute ST/T wave changes.  3. Anxiety - Continue to follow with PCP  Disposition: Follow up in 1 year(s) with Dr. [name] or APP.    Signed, [name], NP 04/16/2020, 3:58 PM [facility]

## 2020-04-16 NOTE — Patient Instructions (Signed)
Medication Instructions:  No medication changes.  *If you need a refill on your cardiac medications before your next appointment, please call your pharmacy*  Lab Work: No lab work today.   Testing/Procedures: Recommend repeat echocardiogram October 2023.   Recommend scheduling your Exercise Tolerance test.  Follow-Up: At [facility], you and your health needs are our priority.  As part of our continuing mission to provide you with exceptional heart care, we have created designated Provider Care Teams.  These Care Teams include your primary Cardiologist (physician) and Advanced Practice Providers (APPs -  Physician Assistants and Nurse Practitioners) who all work together to provide you with the care you need, when you need it.  We recommend signing up for the patient portal called "MyChart".  Sign up information is provided on this After Visit Summary.  MyChart is used to connect with patients for Virtual Visits (Telemedicine).  Patients are able to view lab/test results, encounter notes, upcoming appointments, etc.  Non-urgent messages can be sent to your provider as well.   To learn more about what you can do with MyChart, go to [email or web address].    Your next appointment:   1 year(s)  The format for your next appointment:   In Person  Provider:   You may see [name], MD or one of the following Advanced Practice Providers on your designated Care Team:    Nicolasa Ducking, NP  Eula Listen, PA-C  Marisue Ivan, PA-C  Cadence Wanship, [place]  Gillian Shields, NP   Other Instructions  Your aortic valve was bicuspid - this means it has 2 leaflets (like doors) instead of 3. This is a congenital condition that occurs in about 1-2% of people. The typical progression of this bicuspid aortic valve is that it needs replaced later in life - most often in your 50s or 60s, but it could be later.   Your aortic valve showed no stenosis ("stiffness"). Your aortic valve showed  mild regurgitation ("leakiness").   The aortic valve does not need to be repaired until it is severely stenosed or severely leaky. We monitor this with periodic echocardiograms.

## 2020-04-17 ENCOUNTER — Encounter: Payer: Self-pay | Admitting: Family

## 2020-04-29 ENCOUNTER — Ambulatory Visit: Payer: 59 | Admitting: Nurse Practitioner

## 2020-05-01 ENCOUNTER — Encounter: Payer: Self-pay | Admitting: Family Medicine

## 2020-05-01 ENCOUNTER — Ambulatory Visit: Payer: Self-pay | Admitting: *Deleted

## 2020-05-01 NOTE — Telephone Encounter (Signed)
  C/o increasing ear discomfort x 3-4 days. Pressure noted on bilateral ears, "feels like they need to pop" . C/o jaw pressure and patient reports his mother noted swollen lymph nodes around neck. C/o dry throat with cough at times. Taking zyrtec now with no relief. Patient requesting if medication can be prescribed today to decrease discomfort in ears. No appt available today. Patient reports sending message via My Chart. Care advise given. Patient verbalized understanding of care advise and to call back or go to UC if symptoms worsen.   Reason for Disposition . Ear congestion  Answer Assessment - Initial Assessment Questions 1. LOCATION: "Which ear is involved?"       Both  2. SENSATION: "Describe how the ear feels." (e.g. stuffy, full, plugged)."      Full  3. ONSET:  "When did the ear symptoms start?"       3-4 days ago  4. PAIN: "Do you also have an earache?" If Yes, ask: "How bad is it?" (Scale 1-10; or mild, moderate, severe)     Minimal pain now but pressure is getting worse 5. CAUSE: "What do you think is causing the ear congestion?"     No sure  6. URI: "Do you have a runny nose or cough?"      Dry throat and  cough at times  7. NASAL ALLERGIES: "Are there symptoms of hay fever, such as sneezing or a clear nasal discharge?"     no 8. PREGNANCY: "Is there any chance you are pregnant?" "When was your last menstrual period?"     na  Protocols used: EAR - CONGESTION-A-AH

## 2020-05-01 NOTE — Telephone Encounter (Signed)
Needs virtual visit to decide if wax build up needs to be flushed out or respiratory treatment.

## 2020-05-01 NOTE — Telephone Encounter (Signed)
Please review for Dr. B ° ° °Thanks,  ° °-Desiraye Rolfson  °

## 2020-05-04 NOTE — Telephone Encounter (Signed)
Left message to call back  

## 2020-05-07 NOTE — Telephone Encounter (Signed)
Patient advised. He states his ears have improved. He still has some sinus congestion. Patient declined a virtual visit at this time. He says he would call back if he is no better in a few days.

## 2020-05-25 NOTE — Telephone Encounter (Signed)
Would need OV to see if abx are appropriate. If sx > 2 weeks, can be in person.

## 2020-06-03 ENCOUNTER — Telehealth: Payer: Self-pay | Admitting: Cardiovascular Disease

## 2020-06-03 ENCOUNTER — Other Ambulatory Visit: Payer: Self-pay | Admitting: Family Medicine

## 2020-06-03 ENCOUNTER — Ambulatory Visit: Payer: Self-pay

## 2020-06-03 NOTE — Telephone Encounter (Signed)
STAT if HR is under 50 or over 120 (normal HR is 60-100 beats per minute)  1) What is your heart rate? 115 now - has been sitting at rest for a while   2) Do you have a log of your heart rate readings (document readings)? No readings but has been high   3) Do you have any other symptoms? Has had a little tightness in chest, does not feel himself   Declined appointment with Dr [name] tomorrow at this time.

## 2020-06-03 NOTE — Telephone Encounter (Signed)
Requested medication (s) are due for refill today - no  Requested medication (s) are on the active medication list -no  Future visit scheduled -yes  Last refill: 06/08/2018  Notes to clinic: Request RF- non delegated Rx  Requested Prescriptions  Pending Prescriptions Disp Refills   ALPRAZolam (XANAX) 1 MG tablet [Pharmacy Med Name: ALPRAZOLAM 1 MG TABLET] 15 tablet     Sig: Take 0.5-1 tablets (0.5-1 mg total) by mouth daily as needed for anxiety.      Not Delegated - Psychiatry:  Anxiolytics/Hypnotics Failed - 06/03/2020  3:00 PM      Failed - This refill cannot be delegated      Failed - Urine Drug Screen completed in last 360 days      Passed - Valid encounter within last 6 months    Recent Outpatient Visits           3 months ago Annual physical exam   [facility] [place], [place]   1 year ago Anxiety   [facility] [place], Marzella Schlein, [place]   2 years ago Anxiety   [facility] [place], Marzella Schlein, [place]   2 years ago Anxiety   [facility] [place], Marzella Schlein, [place]   2 years ago Anxiety   [facility] [place], Marzella Schlein, [place]       Future Appointments             In 8 months Bac[place]al[place], Marzella Schlein, MD [facility], [facility]                Requested Prescriptions  Pending Prescriptions Disp Refills   ALPRAZolam Prudy Feeler) 1 MG tablet [Pharmacy Med Name: ALPRAZOLAM 1 MG TABLET] 15 tablet     Sig: Take 0.5-1 tablets (0.5-1 mg total) by mouth daily as needed for anxiety.      Not Delegated - Psychiatry:  Anxiolytics/Hypnotics Failed - 06/03/2020  3:00 PM      Failed - This refill cannot be delegated      Failed - Urine Drug Screen completed in last 360 days      Passed - Valid encounter within last 6 months    Recent Outpatient Visits           3 months ago Annual physical exam   [facility] [place], [place]   1 year ago Anxiety   [facility] [place], Marzella Schlein, [place]   2 years ago Anxiety   [facility] [place], Marzella Schlein, [place]   2 years ago Anxiety   [facility] [place], Marzella Schlein, [place]   2 years ago Anxiety   [facility] [place], Marzella Schlein, [place]       Future Appointments             In 8 months Bacigalupo, Marzella Schlein, MD [facility], [facility]

## 2020-06-03 NOTE — Telephone Encounter (Signed)
Recommend 1 week ZIO monitor and follow-up after.

## 2020-06-03 NOTE — Telephone Encounter (Signed)
Spoke with the patient. Patient sts that he has been having episodes of palpitations that he describes as "pounding in the chest". Pt sts that this had not occurred in several months, but recently (last couple of days) it has occurred after meals. He denies increased caffeine consumption. Pt wanted to be seen today, but there are no available appts. He declined an appt for tomorrow. He has been sen in the past in the office and in the ED with the same complaint.  Adv the patient that I will fwd the update to Dr. [name] and call back with is recommendation. Patient adv the patient to contact the office in the interim if  Needed.

## 2020-06-03 NOTE — Telephone Encounter (Signed)
Pt. Reports he has been having elevated heart rate. Sees cardiology as well. They offered him an appointment and he declined. States the episodes last a few minutes. States he has anxiety as well, and " Ms. Jodi Marble has decreased my Xanax. I need a refill on the 1 mg tablets." Concerned he could have a thyroid "issue, it runs in my family." Only wants to see Dr. Beryle Flock. Please advise pt.  Reason for Disposition . [1] Palpitations AND [2] no improvement after using CARE ADVICE  Answer Assessment - Initial Assessment Questions 1. DESCRIPTION: "Please describe your heart rate or heartbeat that you are having" (e.g., fast/slow, regular/irregular, skipped or extra beats, "palpitations")     Increased heart rate 2. ONSET: "When did it start?" (Minutes, hours or days)      Few days ago 3. DURATION: "How long does it last" (e.g., seconds, minutes, hours)     Few minutes 4. PATTERN "Does it come and go, or has it been constant since it started?"  "Does it get worse with exertion?"   "Are you feeling it now?"     Comes and goes 5. TAP: "Using your hand, can you tap out what you are feeling on a chair or table in front of you, so that I can hear?" (Note: not all patients can do this)       No 6. HEART RATE: "Can you tell me your heart rate?" "How many beats in 15 seconds?"  (Note: not all patients can do this)       Sometimes 115 7. RECURRENT SYMPTOM: "Have you ever had this before?" If Yes, ask: "When was the last time?" and "What happened that time?"      No 8. CAUSE: "What do you think is causing the palpitations?"     Unsure 9. CARDIAC HISTORY: "Do you have any history of heart disease?" (e.g., heart attack, angina, bypass surgery, angioplasty, arrhythmia)      Valve problems 10. OTHER SYMPTOMS: "Do you have any other symptoms?" (e.g., dizziness, chest pain, sweating, difficulty breathing)       No - sometimes palms sweat. 11. PREGNANCY: "Is there any chance you are pregnant?" "When was your  last menstrual period?"       n/a  Protocols used: HEART RATE AND HEARTBEAT QUESTIONS-A-AH

## 2020-06-04 NOTE — Telephone Encounter (Signed)
LMTCB 06/04/2020.  [facility] please schedule apt when he calls back.   Thanks,   -Vernona Rieger

## 2020-06-04 NOTE — Telephone Encounter (Signed)
Spoke with the patient and made him aware of Dr. Jari Sportsman response and recommendation. Patient sts that he wants to check with his insurance company first to see what his out of pocket cost will be.  Patient sts that he will call back if he decides to proceed.

## 2020-06-11 ENCOUNTER — Other Ambulatory Visit: Payer: Self-pay | Admitting: Physician Assistant

## 2020-06-11 DIAGNOSIS — F419 Anxiety disorder, unspecified: Secondary | ICD-10-CM

## 2020-06-11 NOTE — Telephone Encounter (Signed)
Requested medication (s) are due for refill today:   Provider to determine  Requested medication (s) are on the active medication list:   Yes  Future visit scheduled:   Yes   Last ordered: 06/09/2020 Dr. Beryle Flock refused this refill.  Non delegated refill   Requested Prescriptions  Pending Prescriptions Disp Refills   ALPRAZolam (XANAX) 0.5 MG tablet [Pharmacy Med Name: ALPRAZOLAM 0.5 MG TABLET] 5 tablet 0    Sig: Take 1 tablet (0.5 mg total) by mouth daily as needed.      Not Delegated - Psychiatry:  Anxiolytics/Hypnotics Failed - 06/11/2020  2:21 PM      Failed - This refill cannot be delegated      Failed - Urine Drug Screen completed in last 360 days      Passed - Valid encounter within last 6 months    Recent Outpatient Visits           3 months ago Annual physical exam   [facility] [place], [place]   2 years ago Anxiety   [facility] [place], Marzella Schlein, [place]   2 years ago Anxiety   [facility] [place], Marzella Schlein, [place]   2 years ago Anxiety   [facility] [place], Marzella Schlein, MD   2 years ago Anxiety   [facility] [place], Marzella Schlein, MD       Future Appointments             In 8 months Bacigalupo, Marzella Schlein, MD [facility]

## 2020-06-11 NOTE — Telephone Encounter (Signed)
Patient was advised via another message. 

## 2020-06-12 NOTE — Telephone Encounter (Signed)
This was ordered months ago for only 5 pills. Is not a long term medication but looks like it was for a procedure or travel or something acute.Marland KitchenMarland Kitchen

## 2020-06-30 ENCOUNTER — Telehealth (INDEPENDENT_AMBULATORY_CARE_PROVIDER_SITE_OTHER): Payer: 59 | Admitting: Family Medicine

## 2020-06-30 ENCOUNTER — Other Ambulatory Visit: Payer: Self-pay

## 2020-06-30 ENCOUNTER — Encounter: Payer: Self-pay | Admitting: Family Medicine

## 2020-06-30 ENCOUNTER — Ambulatory Visit: Payer: Self-pay

## 2020-06-30 VITALS — Temp 99.3°F

## 2020-06-30 DIAGNOSIS — R509 Fever, unspecified: Secondary | ICD-10-CM

## 2020-06-30 DIAGNOSIS — J0110 Acute frontal sinusitis, unspecified: Secondary | ICD-10-CM

## 2020-06-30 MED ORDER — AMOXICILLIN-POT CLAVULANATE 875-125 MG PO TABS: 1.0000 | ORAL_TABLET | Freq: Two times a day (BID) | ORAL | 0 refills | Status: DC

## 2020-06-30 MED ORDER — PREDNISONE 5 MG PO TABS: ORAL_TABLET | ORAL | 0 refills | Status: DC

## 2020-06-30 NOTE — Telephone Encounter (Signed)
HR 100-110. Normally runs 97.6 and temp max 99.3. Sx: head and chest congestion. Coughing up green phlegm. Pt  was tested for covid during call. Pt with yellow nasal congestion. See by provider today and prescribed prednisone and amoxicillin.  Denies SOB, chest pain, dizziness.  Pt has h/i bicuspid aortic valve. Advised pt to begin taking tylenol or Ibuprofen prn for fever/ body aches. Pt is unvaccinated.  Home care advice given and pt verbalized understanding.   Reason for Disposition . [1] [id number] diagnosed by positive lab test (e.g., PCR, rapid self-test kit) AND [2] mild symptoms (e.g., cough, fever, others) AND [6] no complications or SOB    Just tested PCR at PCP office  Answer Assessment - Initial Assessment Questions 1. COVID-19 DIAGNOSIS: "Who made your COVID-19 diagnosis?" "Was it confirmed by a positive lab test?" If not diagnosed by a HCP, ask "Are there lots of cases (community spread) where you live?" Note: See public health department website, if unsure.     Just tested BFP- yes 2. COVID-19 EXPOSURE: "Was there any known exposure to COVID before the symptoms began?" CDC Definition of close contact: within 6 feet (2 meters) for a total of 15 minutes or more over a 24-hour period.      no 3. ONSET: "When did the COVID-19 symptoms start?"      06/25/20 4. WORST SYMPTOM: "What is your worst symptom?" (e.g., cough, fever, shortness of breath, muscle aches)    Head congestion 5. COUGH: "Do you have a cough?" If Yes, ask: "How bad is the cough?"       Yes coughing up green phlegm 6. FEVER: "Do you have a fever?" If Yes, ask: "What is your temperature, how was it measured, and when did it start?"     98.3 axillary- yesterday 7. RESPIRATORY STATUS: "Describe your breathing?" (e.g., shortness of breath, wheezing, unable to speak)      no 8. BETTER-SAME-WORSE: "Are you getting better, staying the same or getting worse compared to yesterday?"  If getting worse, ask, "In what way?"      worse 9. HIGH RISK DISEASE: "Do you have any chronic medical problems?" (e.g., asthma, heart or lung disease, weak immune system, obesity, etc.)     Bicuspid aortic valve 10. VACCINE: "Have you gotten the COVID-19 vaccine?" If Yes ask: "Which one, how many shots, when did you get it?"       no  12. OTHER SYMPTOMS: "Do you have any other symptoms?"  (e.g., chills, fatigue, headache, loss of smell or taste, muscle pain, sore throat; new loss of smell or taste especially support the diagnosis of COVID-19)      Fatigued, head congestion, elevated HR 100-110- base lie: 60-70's.  Protocols used: CORONAVIRUS (COVID-19) DIAGNOSED OR SUSPECTED-A-AH

## 2020-06-30 NOTE — Progress Notes (Signed)
MyChart Video Visit    Virtual Visit via Video Note   This visit type was conducted due to national recommendations for restrictions regarding the COVID-19 Pandemic (e.g. social distancing) in an effort to limit this patient's exposure and mitigate transmission in our community. This patient is at least at moderate risk for complications without adequate follow up. This format is felt to be most appropriate for this patient at this time. Physical exam was limited by quality of the video and audio technology used for the visit.   Patient location: Home Provider location: Office  I discussed the limitations of evaluation and management by telemedicine and the availability of in person appointments. The patient expressed understanding and agreed to proceed.  Patient: [name] [name]   DOB: May 05, 1986   34 y.o. Male  MRN: [id number] Visit Date: 06/30/2020  Today's healthcare provider: Dortha Kern, PA-C   No chief complaint on file.  Subjective    Upper Respiratory Infection: Patient complains of symptoms of a URI. Symptoms include congestion, cough, diarrhea and sore throat. Onset of symptoms was 5 days ago, gradually worsening since that time. He also c/o bilateral ear pressure/pain, congestion, facial pain and productive cough with  green colored sputum for the past 1 day .  He is drinking plenty of fluids. Evaluation to date: none. Treatment to date: none.  Pt has congestion, sore throat, cough, sinus pressure x 6 days Pt tested neg for covid 06/27/20    Pt reports that he is allergic to azithromycin.  Past Medical History:  Diagnosis Date  . Acid reflux   . Allergy   . Anxiety   . Bicuspid aortic valve 04/2020  . Heart murmur    Past Surgical History:  Procedure Laterality Date  . VASECTOMY     Social History   Tobacco Use  . Smoking status: Never Smoker  . Smokeless tobacco: Current User  Vaping Use  . Vaping Use: Every day  Substance Use Topics  . Alcohol  use: Yes    Alcohol/week: 1.0 - 4.0 standard drink    Types: 1 - 4 Cans of beer per week  . Drug use: No   Family History  Problem Relation Age of Onset  . Thyroid disease Mother   . Anxiety disorder Mother   . Heart murmur Mother   . Diabetes Father        pre-diabetes  . Hypothyroidism Sister   . Cancer Paternal Grandmother        thinks it was liver and pancreatic  . Diabetes Paternal Grandfather   . Heart Problems Maternal Grandmother   . Prostate cancer Neg Hx   . Kidney cancer Neg Hx   . Bladder Cancer Neg Hx   . Breast cancer Neg Hx   . Colon cancer Neg Hx    Allergies  Allergen Reactions  . Erythromycin Rash      Medications: Outpatient Medications Prior to Visit  Medication Sig  . ALPRAZolam (XANAX) 0.5 MG tablet Take 1 tablet (0.5 mg total) by mouth daily as needed.  . cetirizine (ZYRTEC) 10 MG tablet Take 10 mg by mouth daily.  . NON FORMULARY [place] Gummies---Ashwaghanda: 2 gummies in the morning and 2 gummies in the evening  . omeprazole (PRILOSEC) 20 MG capsule Take 1 capsule (20 mg total) by mouth daily.  . Red Yeast Rice Extract (RED YEAST RICE PO) Take by mouth daily.   No facility-administered medications prior to visit.    Review of Systems  Objective    Temp 99.3 F (37.4 C)    Physical Exam: WDWN male with extreme nasal stuffiness distress.  Head: Normocephalic, atraumatic. Neck: Supple, NROM Respiratory: No apparent distress Psych: Normal mood and affect      Assessment & Plan     1. Acute frontal sinusitis, recurrence not specified Developed severe nasal stuffiness with yellow mucus, cough productive of green sputum, frontal headache, teeth aching and low grade fever over the past 4-5 days. Tested for COVID at home the day after symptoms started. Will retest today and start antibiotic with prednisone taper for extreme nasal congestion. Quarantine at home with family that has had similar symptoms over the past week. Increase fluid  intake and may add Tylenol or Advil prn. - amoxicillin-clavulanate (AUGMENTIN) 875-125 MG tablet; Take 1 tablet by mouth 2 (two) times daily.  Dispense: 20 tablet; Refill: 0 - predniSONE (DELTASONE) 5 MG tablet; Taper down by 1 tablet by mouth daily starting at 6 day 1, then, 5 day 2, 4 day 3, 3 day 4, 2 day 5 and 1 day 6. Divide dosage among meals and bedtime each day.  Dispense: 21 tablet; Refill: 0  2. Low grade fever Temp 99.3 today with frontal headache and general malaise. Check COVID test with flu test. - Coronavirus (COVID-19) with Influenza A and Influenza B   No follow-ups on file.     I discussed the assessment and treatment plan with the patient. The patient was provided an opportunity to ask questions and all were answered. The patient agreed with the plan and demonstrated an understanding of the instructions.   The patient was advised to call back or seek an in-person evaluation if the symptoms worsen or if the condition fails to improve as anticipated.  I provided 20 minutes of non-face-to-face time during this encounter.  I, Shaunta Oncale, PA-C, have reviewed all documentation for this visit. The documentation on 06/30/20 for the exam, diagnosis, procedures, and orders are all accurate and complete.   Dortha Kern, PA-C [employer] [phone] (phone) [phone] (fax)  [facility]

## 2020-07-02 ENCOUNTER — Telehealth: Payer: Self-pay | Admitting: Family Medicine

## 2020-07-02 NOTE — Telephone Encounter (Signed)
Copied from CRM [id number]. Topic: General - Other >> Jul 02, 2020  3:38 PM Lyn Hollingshead D wrote: PT need a call back to go over this medication, he wants 1 ml // ALPRAZolam (XANAX) 0.5 MG tablet [[id number]]  // please advise

## 2020-07-03 LAB — COVID-19, FLU A+B NAA
Influenza A, NAA: NOT DETECTED
Influenza B, NAA: NOT DETECTED
SARS-CoV-2, NAA: NOT DETECTED

## 2020-07-06 ENCOUNTER — Ambulatory Visit (INDEPENDENT_AMBULATORY_CARE_PROVIDER_SITE_OTHER): Payer: 59

## 2020-07-06 ENCOUNTER — Other Ambulatory Visit: Payer: Self-pay

## 2020-07-06 ENCOUNTER — Encounter: Payer: Self-pay | Admitting: Physician Assistant

## 2020-07-06 ENCOUNTER — Ambulatory Visit: Payer: 59 | Admitting: Physician Assistant

## 2020-07-06 VITALS — BP 120/86 | HR 79 | Ht 72.0 in | Wt 207.0 lb

## 2020-07-06 DIAGNOSIS — R002 Palpitations: Secondary | ICD-10-CM

## 2020-07-06 DIAGNOSIS — R072 Precordial pain: Secondary | ICD-10-CM | POA: Diagnosis not present

## 2020-07-06 DIAGNOSIS — H539 Unspecified visual disturbance: Secondary | ICD-10-CM

## 2020-07-06 DIAGNOSIS — Q231 Congenital insufficiency of aortic valve: Secondary | ICD-10-CM

## 2020-07-06 NOTE — Telephone Encounter (Signed)
Would need visit to discuss.  

## 2020-07-06 NOTE — Progress Notes (Signed)
Cardiology Office Note    Date:  07/06/2020   ID:  [name] [name], DOB 11-11-1985, MRN [id number]  PCP:  [name], MD  Cardiologist:  [name], MD  Electrophysiologist:  None   Chief Complaint: Palpitations   History of Present Illness:   [name] [name] is a 35 y.o. male with history of bicuspid aortic valve with mild regurgitation by echo in 03/2020, HLD, anxiety, and chest pain who presents for evaluation of palpitations.  He was seen by Dr. [name] as a new patient for ED follow-up of chest pain and shortness of in 03/2020.  At that time he noted intermittent episodes of left-sided chest tightness that radiated to his back and between his shoulder blades occurring both with activity and at rest.  At his ED visit in early 03/2020 high-sensitivity troponin was negative x2, EKG showed no ischemic changes, and chest x-ray was unrevealing.  Subsequent echo on 04/02/2020 showed an EF of 55 to 60%, no regional wall motion abnormalities, no LVH, normal LV diastolic function parameters, normal RV systolic function and ventricular cavity size, bicuspid aortic valve with mild regurgitation and sclerosis without evidence of stenosis, estimated right atrial pressure 3 mmHg, and a normal size and structure aortic root measuring 3.2 cm, ascending aorta 3.1 cm, and aortic arch 2.5 cm.  He was last seen in follow-up on 04/16/2020 and was doing well from a cardiac perspective.  He did note that his grandparents had history of valve replacements though he was unsure of the specifics.  He reported one episode of chest discomfort that self resolved after several minutes.  He continued to exercise daily with high intensity without limitations.  It was noted the previously recommended ETT was postponed due to travel.  He contacted our office in early 06/2020 noting a several day history of palpitations occurring after meals.  Zio patch was recommended with subsequent follow up.  He preferred to check with  his insurance company first and advised [place] he would contact our office if he wanted to proceed with outpatient cardiac monitoring.  He comes in today continuing to note an approximate 16-month history of tachypalpitations that have been progressively worsening over the past several weeks.  Episodes are often described as a "pounding" sensation.  At times there is some associated chest pain, dizziness, diaphoresis, and vision disturbance.  No syncope.  Episodes will occur randomly and vary in duration, sometimes lasting for an hour and other times lasting several days/up to a week in duration.  He reports his resting heart rate is typically in the upper 50s to low 60s bpm.  This past week while at rest at home she noted his heart rate trended into the 1 teens to 120s bpm.  He does report underlying anxiety though feels this is different.  As needed Xanax has not helped with his tachypalpitations.  Sometimes these palpitations are more noticeable in the evening hours when he is lying down.  He previously consumed energy drinks since his teenage years though more recently has discontinued these and now drinks approximately 16 ounces of coffee each morning.  He does chew tobacco.  He denies smoking tobacco or illicit substances.  He reports his mother has a history of palpitations and hyperthyroidism.  He was recently playing laser tag with his son and felt he was more winded and sweaty than he should have been for the that level of activity.  He last had palpitations earlier this morning.   Labs independently reviewed:  03/2020 - HGB 15.9, PLT 188, potassium 4.1, BUN 12, serum creatinine 1.48 02/2020 - albumin 4.8, AST/ALT normal, TC 232, TG 250, HDL 38, LDL 148, TSH normal  Past Medical History:  Diagnosis Date  . Acid reflux   . Allergy   . Anxiety   . Bicuspid aortic valve 04/2020  . Heart murmur     Past Surgical History:  Procedure Laterality Date  . VASECTOMY      Current Medications: Current  Meds  Medication Sig  . ALPRAZolam (XANAX) 0.5 MG tablet Take 1 tablet (0.5 mg total) by mouth daily as needed.  [name] amoxicillin-clavulanate (AUGMENTIN) 875-125 MG tablet Take 1 tablet by mouth 2 (two) times daily.  [name] omeprazole (PRILOSEC) 20 MG capsule Take 1 capsule (20 mg total) by mouth daily.    Allergies:   Erythromycin   Social History   Socioeconomic History  . Marital status: Married    Spouse name: Not on file  . Number of children: 3  . Years of education: Not on file  . Highest education level: Not on file  Occupational History    Comment: city of [place]  Tobacco Use  . Smoking status: Never Smoker  . Smokeless tobacco: Current User    Types: Chew  Vaping Use  . Vaping Use: Never used  Substance and Sexual Activity  . Alcohol use: Yes    Alcohol/week: 1.0 - 4.0 standard drink    Types: 1 - 4 Cans of beer per week  . Drug use: No  . Sexual activity: Yes    Partners: Female    Birth control/protection: Surgical  Other Topics Concern  . Not on file  Social History Narrative  . Not on file   Social Determinants of Health   Financial Resource Strain: Not on file  Food Insecurity: Not on file  Transportation Needs: Not on file  Physical Activity: Not on file  Stress: Not on file  Social Connections: Not on file     Family History:  The patient's family history includes Anxiety disorder in his mother; Cancer in his paternal grandmother; Diabetes in his father and paternal grandfather; Heart Problems in his maternal grandmother; Heart murmur in his mother; Hypothyroidism in his sister; Thyroid disease in his mother. There is no history of Prostate cancer, Kidney cancer, Bladder Cancer, Breast cancer, or Colon cancer.  ROS:   Review of Systems  Constitutional: Positive for diaphoresis and malaise/fatigue. Negative for chills, fever and weight loss.  HENT: Negative for congestion.   Eyes: Positive for blurred vision. Negative for discharge and redness.   Respiratory: Negative for cough, sputum production, shortness of breath and wheezing.   Cardiovascular: Positive for chest pain and palpitations. Negative for orthopnea, claudication, leg swelling and PND.  Gastrointestinal: Negative for abdominal pain, heartburn, nausea and vomiting.  Musculoskeletal: Negative for falls and myalgias.  Skin: Negative for rash.  Neurological: Positive for dizziness and weakness. Negative for tingling, tremors, sensory change, speech change, focal weakness and loss of consciousness.  Endo/Heme/Allergies: Does not bruise/bleed easily.  Psychiatric/Behavioral: Negative for substance abuse. The patient is not nervous/anxious.   All other systems reviewed and are negative.    EKGs/Labs/Other Studies Reviewed:    Studies reviewed were summarized above. The additional studies were reviewed today:  2D echo 04/02/2020: 1. Left ventricular ejection fraction, by estimation, is 55 to 60%. The  left ventricle has normal function. The left ventricle has no regional  wall motion abnormalities. Left ventricular diastolic parameters were  normal.  2. Right ventricular systolic function is normal. The right ventricular  size is normal.  3. The mitral valve is normal in structure. No evidence of mitral valve  regurgitation. No evidence of mitral stenosis.  4. Bicuspid aortic valve type 1 (raphe/calcification between R-L cusp).  The aortic valve is bicuspid. Aortic valve regurgitation is mild. Mild  aortic valve sclerosis is present, with no evidence of aortic valve  stenosis.  5. The inferior vena cava is normal in size with greater than 50%  respiratory variability, suggesting right atrial pressure of 3 mmHg.   EKG:  EKG is ordered today.  The EKG ordered today demonstrates NSR, 79 bpm, normal axis, no acute ST-T changes  Recent Labs: 02/14/2020: ALT 24; TSH 2.100 03/04/2020: BUN 12; Creatinine, Ser 1.48; Hemoglobin 15.9; Platelets 188; Potassium 4.1; Sodium 138   Recent Lipid Panel    Component Value Date/Time   CHOL 232 (H) 02/14/2020 0804   TRIG 250 (H) 02/14/2020 0804   HDL 38 (L) 02/14/2020 0804   CHOLHDL 6.1 (H) 02/14/2020 0804   LDLCALC 148 (H) 02/14/2020 0804    PHYSICAL EXAM:    VS:  BP 120/86 (BP Location: Left Arm, Patient Position: Sitting, Cuff Size: Normal)   Pulse 79   Ht 6' (1.829 m)   Wt 207 lb (93.9 kg)   SpO2 97%   BMI 28.07 kg/m   BMI: Body mass index is 28.07 kg/m.  Physical Exam Vitals reviewed.  Constitutional:      Appearance: He is well-developed and well-nourished.  HENT:     Head: Normocephalic and atraumatic.  Eyes:     General:        Right eye: No discharge.        Left eye: No discharge.  Neck:     Vascular: No JVD.  Cardiovascular:     Rate and Rhythm: Normal rate and regular rhythm.     Pulses: No midsystolic click and no opening snap.          Posterior tibial pulses are 2+ on the right side and 2+ on the left side.     Heart sounds: Normal heart sounds, S1 normal and S2 normal. Heart sounds not distant. No murmur heard. No friction rub.  Pulmonary:     Effort: Pulmonary effort is normal. No respiratory distress.     Breath sounds: Normal breath sounds. No decreased breath sounds, wheezing or rales.  Chest:     Chest wall: No tenderness.  Abdominal:     General: There is no distension.     Palpations: Abdomen is soft.     Tenderness: There is no abdominal tenderness.  Musculoskeletal:        General: No edema.     Cervical back: Normal range of motion.  Skin:    General: Skin is warm and dry.     Nails: There is no clubbing or cyanosis.  Neurological:     Mental Status: He is alert and oriented to person, place, and time.  Psychiatric:        Mood and Affect: Mood and affect normal.        Speech: Speech normal.        Behavior: Behavior normal.        Thought Content: Thought content normal.        Judgment: Judgment normal.     Wt Readings from Last 3 Encounters:  07/06/20  207 lb (93.9 kg)  04/16/20 209 lb (94.8 kg)  03/10/20 204 lb  8 oz (92.8 kg)     ASSESSMENT & PLAN:   1. Palpitations: He notes his palpitations have been present for approximately 4 months though more recently over the past several weeks these have been increasing in burden.  With these palpitations he does note vision disturbance, diaphoresis, dizziness, and at times chest discomfort.  Recent TSH and potassium normal.  He only drinks 16 ounces of coffee daily and is actually decreased his caffeine consumption by no longer drinking energy drinks.  Recommend adequate hydration.  Place 2-week ZIO AT with further recommendations pending.  2. Bicuspid aortic valve with mild aortic regurgitation: No murmur auscultated on exam today.  Normal size aortic root, ascending aorta, and aortic arch on echo in 03/2020.  Continued heart healthy diet, cardiovascular exercise, and optimal blood pressure control recommended.  Primary cardiologist plans for repeat echo in 04/2022.  3. Precordial pain: Uncertain etiology.  Previously recommended to undergo ETT.  He indicates he works out at [employer] on a regular basis.  Await Zio patch results for further recommendations regarding stress testing for risk stratification.  Disposition: F/u with Dr. [name] or an APP in 6 weeks.   Medication Adjustments/Labs and Tests Ordered: Current medicines are reviewed at length with the patient today.  Concerns regarding medicines are outlined above. Medication changes, Labs and Tests ordered today are summarized above and listed in the Patient Instructions accessible in Encounters.   Signed, Eula Listen, PA-C 07/06/2020 2:14 PM     [facility] -  [address] [place], [place] [address] [phone]

## 2020-07-06 NOTE — Telephone Encounter (Signed)
Please advise 

## 2020-07-06 NOTE — Patient Instructions (Addendum)
  Medication Instructions:  No changes  *If you need a refill on your cardiac medications before your next appointment, please call your pharmacy*   Lab Work: None  If you have labs (blood work) drawn today and your tests are completely normal, you will receive your results only by: [name] MyChart Message (if you have MyChart) OR . A paper copy in the mail If you have any lab test that is abnormal or we need to change your treatment, we will call you to review the results.   Testing/Procedures: Your physician has recommended that you wear a Zio monitor. This monitor is a medical device that records the heart's electrical activity. Doctors most often use these monitors to diagnose arrhythmias. Arrhythmias are problems with the speed or rhythm of the heartbeat. The monitor is a small device applied to your chest. You can wear one while you do your normal daily activities. While wearing this monitor if you have any symptoms to push the button and record what you felt. Once you have worn this monitor for the period of time provider prescribed (Usually 14 days), you will return the monitor device in the postage paid box. Once it is returned they will download the data collected and provide [place] with a report which the provider will then review and we will call you with those results. Important tips:  1. Avoid showering during the first 24 hours of wearing the monitor. 2. Avoid excessive sweating to help maximize wear time. 3. Do not submerge the device, no hot tubs, and no swimming pools. 4. Keep any lotions or oils away from the patch. 5. After 24 hours you may shower with the patch on. Take brief showers with your back facing the shower head.  6. Do not remove patch once it has been placed because that will interrupt data and decrease adhesive wear time. 7. Push the button when you have any symptoms and write down what you were feeling. 8. Once you have completed wearing your monitor, remove and place  into box which has postage paid and place in your outgoing mailbox.  9. If for some reason you have misplaced your box then call our office and we can provide another box and/or mail it off for you.         Follow-Up: At [facility], you and your health needs are our priority.  As part of our continuing mission to provide you with exceptional heart care, we have created designated Provider Care Teams.  These Care Teams include your primary Cardiologist (physician) and Advanced Practice Providers (APPs -  Physician Assistants and Nurse Practitioners) who all work together to provide you with the care you need, when you need it.  Your next appointment:   6 week(s)  The format for your next appointment:   In Person  Provider:   Eula Listen, PA-C

## 2020-07-07 NOTE — Telephone Encounter (Signed)
Looks like she discussed discontinuing medication with the patient from her note. Recommend virtual visit to discuss treatment going forward

## 2020-07-07 NOTE — Telephone Encounter (Signed)
Pt advised.  He states he has always been on Xanax 1mg  as needed.  He recently had a physical with a PA in August and she reduced it to 0.5mg .  He says he is not sure why she did that, since he only uses them very rarely.    Is it okay to go back to the original dose without an appointment?  Thanks,   September

## 2020-07-09 NOTE — Telephone Encounter (Signed)
Talked with pt and was able to set up a virtual visit for Friday, January 14th at 10:40 a.m. to discuss the information below.

## 2020-07-14 ENCOUNTER — Telehealth: Payer: Self-pay | Admitting: Cardiovascular Disease

## 2020-07-14 NOTE — Telephone Encounter (Signed)
Spoke with the patient. Patient had a live zio placed in the office on 07/06/20. He is to wear the monitor for 2 weeks (6 more days). 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 Pt sts that while wearing the monitor he has had episodes similar to the reason for the monitor being ordered. Adv the patient to continue wearing the monitor as long as possible, if it falls off completely prior to the 14 days he should contact [employer] to let them know he will be removing it early and to go ahead and mail it back.  Patient verbalized understanding and voiced appreciation for the call.

## 2020-07-14 NOTE — Telephone Encounter (Signed)
Patient calling in regarding zio monitor issue with not sticking. Patient has been using medical tape but sweats every day with his job causing the tape to come off  Please advise patient on what he should do

## 2020-07-17 ENCOUNTER — Telehealth (INDEPENDENT_AMBULATORY_CARE_PROVIDER_SITE_OTHER): Payer: 59 | Admitting: Family Medicine

## 2020-07-17 ENCOUNTER — Encounter: Payer: Self-pay | Admitting: Family Medicine

## 2020-07-17 DIAGNOSIS — H6983 Other specified disorders of Eustachian tube, bilateral: Secondary | ICD-10-CM

## 2020-07-17 DIAGNOSIS — H6993 Unspecified Eustachian tube disorder, bilateral: Secondary | ICD-10-CM | POA: Insufficient documentation

## 2020-07-17 DIAGNOSIS — F419 Anxiety disorder, unspecified: Secondary | ICD-10-CM

## 2020-07-17 DIAGNOSIS — F410 Panic disorder [episodic paroxysmal anxiety] without agoraphobia: Secondary | ICD-10-CM

## 2020-07-17 DIAGNOSIS — J3089 Other allergic rhinitis: Secondary | ICD-10-CM | POA: Diagnosis not present

## 2020-07-17 MED ORDER — ALBUTEROL SULFATE HFA 108 (90 BASE) MCG/ACT IN AERS: 2.0000 | INHALATION_SPRAY | Freq: Four times a day (QID) | RESPIRATORY_TRACT | 2 refills | Status: DC | PRN

## 2020-07-17 MED ORDER — ALPRAZOLAM 1 MG PO TABS: 1.0000 mg | ORAL_TABLET | Freq: Every day | ORAL | 0 refills | Status: DC | PRN

## 2020-07-17 NOTE — Assessment & Plan Note (Signed)
Currently with slight exacerbation He feels like work-up for palpitations and concerned about his heart is what worries him the most He was previously on Celexa, but doing so well and not having daily anxiety symptoms, though this was tapered and discontinued He seems to still not be having daily anxiety and he uses Xanax very infrequently I emphasized the importance of not taking Xanax regularly Will refill Xanax at previous prescription strength and encouraged him to spread out this prescription

## 2020-07-17 NOTE — Patient Instructions (Signed)

## 2020-07-17 NOTE — Progress Notes (Signed)
MyChart Video Visit    Virtual Visit via Video Note   This visit type was conducted due to national recommendations for restrictions regarding the COVID-19 Pandemic (e.g. social distancing) in an effort to limit this patient's exposure and mitigate transmission in our community. This patient is at least at moderate risk for complications without adequate follow up. This format is felt to be most appropriate for this patient at this time. Physical exam was limited by quality of the video and audio technology used for the visit.    Patient location: home Provider location: [facility] Persons involved in the visit: patient, provider  I discussed the limitations of evaluation and management by telemedicine and the availability of in person appointments. The patient expressed understanding and agreed to proceed.  Patient: [name] [name]   DOB: [birth year]   35 y.o. Male  MRN: [id number] Visit Date: 07/17/2020  Today's healthcare provider: Shirlee Latch, MD   Chief Complaint  Patient presents with  . Anxiety   Subjective    HPI HPI    Patient is upset that he is having to do visit, states "I hope I don't get charged for this visit cause I was just seen in August."   Last edited by Myles Lipps, CMA on 07/17/2020 10:44 AM. (History)      Anxiety, Follow-up  He was last seen for anxiety 5 months ago. Changes made at last visit include counseled on Xanax not appropriate for chronic anxiety. Patient aware that he may need to be re-evaluated for counseling if anxiety continues. Patient advised that he may need daily SSRI and transition to hydroxyzine. Patient was given refill of Xanax 0.5 mg five tablets - was previously using 1mg .   He reports fair compliance with treatment. He reports excellent tolerance of treatment. He is not having side effects.   He feels his anxiety is mild and Unchanged since last visit.  Sporadic anxiety, not daily.  Had not  taken any since 2019 before visit in August.  Most days, does not need medication.    Symptoms: No chest pain No difficulty concentrating  No dizziness No fatigue  Yes feelings of losing control No insomnia  No irritable No palpitations  No panic attacks No racing thoughts  No shortness of breath No sweating  No tremors/shakes    GAD-7 Results GAD-7 Generalized Anxiety Disorder Screening Tool 07/17/2020 06/08/2018 09/18/2017  1. Feeling Nervous, Anxious, or on Edge 1 1 1   2. Not Being Able to Stop or Control Worrying 0 1 1  3. Worrying Too Much About Different Things 1 1 1   4. Trouble Relaxing 1 1 0  5. Being So Restless it's Hard To Sit Still 0 1 0  6. Becoming Easily Annoyed or Irritable 0 1 1  7. Feeling Afraid As If Something Awful Might Happen 0 0 1  Total GAD-7 Score 3 6 5   Difficulty At Work, Home, or Getting  Along With Others? Not difficult at all Not difficult at all -    PHQ-9 Scores PHQ9 SCORE ONLY 07/17/2020 02/13/2020 11/20/2018  PHQ-9 Total Score 1 1 0   ---------------------------------------------------------------------------------------------------   Patient Active Problem List   Diagnosis Date Noted  . Dysfunction of both eustachian tubes 07/17/2020  . Non-restorative sleep 12/20/2017  . Migraine with aura 12/20/2017  . Fatigue 09/19/2017  . Panic attacks 08/18/2017  . GERD (gastroesophageal reflux disease) 06/01/2017  . Anxiety 06/01/2017  . Non-seasonal allergic rhinitis 06/01/2017   Social History  Tobacco Use  . Smoking status: Never Smoker  . Smokeless tobacco: Current User    Types: Chew  Vaping Use  . Vaping Use: Never used  Substance Use Topics  . Alcohol use: Yes    Alcohol/week: 1.0 - 4.0 standard drink    Types: 1 - 4 Cans of beer per week  . Drug use: No   Allergies  Allergen Reactions  . Erythromycin Rash      Medications: Outpatient Medications Prior to Visit  Medication Sig  . omeprazole (PRILOSEC) 20 MG capsule Take 1  capsule (20 mg total) by mouth daily.  . [DISCONTINUED] ALPRAZolam (XANAX) 0.5 MG tablet Take 1 tablet (0.5 mg total) by mouth daily as needed.  . [DISCONTINUED] amoxicillin-clavulanate (AUGMENTIN) 875-125 MG tablet Take 1 tablet by mouth 2 (two) times daily.   No facility-administered medications prior to visit.    Review of Systems  Constitutional: Negative.   Respiratory: Negative.   Cardiovascular: Negative.   Psychiatric/Behavioral: Positive for agitation. The patient is nervous/anxious.     Last CBC Lab Results  Component Value Date   WBC 6.6 03/04/2020   HGB 15.9 03/04/2020   HCT 47.7 03/04/2020   MCV 88.7 03/04/2020   MCH 29.6 03/04/2020   RDW 11.9 03/04/2020   PLT 188 03/04/2020      Objective    There were no vitals taken for this visit. BP Readings from Last 3 Encounters:  07/06/20 120/86  04/16/20 122/78  03/10/20 130/90   Wt Readings from Last 3 Encounters:  07/06/20 207 lb (93.9 kg)  04/16/20 209 lb (94.8 kg)  03/10/20 204 lb 8 oz (92.8 kg)      Physical Exam Constitutional:      General: He is not in acute distress.    Appearance: Normal appearance. He is not diaphoretic.  HENT:     Head: Normocephalic.  Eyes:     Conjunctiva/sclera: Conjunctivae normal.  Pulmonary:     Effort: Pulmonary effort is normal. No respiratory distress.  Neurological:     Mental Status: He is alert and oriented to person, place, and time. Mental status is at baseline.        Assessment & Plan     Problem List Items Addressed This Visit      Respiratory   Non-seasonal allergic rhinitis    Longstanding issue Uncontrolled Now with eustachian tube dysfunction as below Encouraged Flonase use regularly Can add daily antihistamine as well Would consider addition of Singulair if he is using the other 2 without good improvement        Nervous and Auditory   Dysfunction of both eustachian tubes    Ongoing for several months Uncontrolled Encouraged treatment  of allergic rhinitis as above Start Flonase daily with good compliance        Other   Anxiety - Primary    Currently with slight exacerbation He feels like work-up for palpitations and concerned about his heart is what worries him the most He was previously on Celexa, but doing so well and not having daily anxiety symptoms, though this was tapered and discontinued He seems to still not be having daily anxiety and he uses Xanax very infrequently I emphasized the importance of not taking Xanax regularly Will refill Xanax at previous prescription strength and encouraged him to spread out this prescription      Relevant Medications   ALPRAZolam (XANAX) 1 MG tablet   Panic attacks    Infrequent and fairly well-controlled Previously on Celexa, but  now having infrequent anxiety Using Xanax very sparingly, which he can continue We have discussed the addictive nature of this and how it is not for chronic daily anxiety      Relevant Medications   ALPRAZolam (XANAX) 1 MG tablet       Return in about 7 months (around 02/14/2021) for CPE, as scheduled.     I discussed the assessment and treatment plan with the patient. The patient was provided an opportunity to ask questions and all were answered. The patient agreed with the plan and demonstrated an understanding of the instructions.   The patient was advised to call back or seek an in-person evaluation if the symptoms worsen or if the condition fails to improve as anticipated.  I provided 20 minutes of non-face-to-face time during this encounter.  I, Shirlee Latch, MD, have reviewed all documentation for this visit. The documentation on 07/17/20 for the exam, diagnosis, procedures, and orders are all accurate and complete.   Bacigalupo, Marzella Schlein, MD, MPH [facility]

## 2020-07-17 NOTE — Assessment & Plan Note (Signed)
Longstanding issue Uncontrolled Now with eustachian tube dysfunction as below Encouraged Flonase use regularly Can add daily antihistamine as well Would consider addition of Singulair if he is using the other 2 without good improvement

## 2020-07-17 NOTE — Assessment & Plan Note (Signed)
Infrequent and fairly well-controlled Previously on Celexa, but now having infrequent anxiety Using Xanax very sparingly, which he can continue We have discussed the addictive nature of this and how it is not for chronic daily anxiety

## 2020-07-17 NOTE — Assessment & Plan Note (Signed)
Ongoing for several months Uncontrolled Encouraged treatment of allergic rhinitis as above Start Flonase daily with good compliance

## 2020-07-27 ENCOUNTER — Telehealth: Payer: Self-pay

## 2020-07-27 MED ORDER — ONDANSETRON HCL 4 MG PO TABS: 4.0000 mg | ORAL_TABLET | Freq: Three times a day (TID) | ORAL | 0 refills | Status: DC | PRN

## 2020-07-27 MED ORDER — ALBUTEROL SULFATE HFA 108 (90 BASE) MCG/ACT IN AERS: 2.0000 | INHALATION_SPRAY | Freq: Four times a day (QID) | RESPIRATORY_TRACT | 2 refills | Status: AC | PRN

## 2020-07-27 NOTE — Telephone Encounter (Signed)
Please review. Patient is requesting meds called in for nausea and an inhaler.

## 2020-07-27 NOTE — Telephone Encounter (Signed)
Rx sent 

## 2020-07-27 NOTE — Telephone Encounter (Signed)
Patient advised. Reports that he has albuterol inhaler already and that he was hoping to see if he can get nebulizer machine with treatments. States that his father got one and it has help his father a lot more than the inhaler.

## 2020-07-27 NOTE — Telephone Encounter (Signed)
Copied from CRM [id number]. Topic: Quick Communication - See Telephone Encounter >> Jul 27, 2020  9:33 AM Aretta Nip wrote: CRM for notification. See Telephone encounter for: 07/27/20.Pt has called and has tested positive for covid he is extremely nauseated. He wants meds called in and an inhaler prescribed like his father who tested positive. I offered a virtual appt but pt vehemently stated he would not do that he just had an appt last week and this was nothing but a money racket and wants medication without appt  Pt was told that I would relay the message and he would get a cb at [phone]

## 2020-07-27 NOTE — Telephone Encounter (Signed)
Would not be able to get nebulizer machine covered by insurance for acute illness.  This is for people with significant lung disease that cannot use inhaler.  Inhaler and nebulizer work the same in people with normal lung function

## 2020-07-28 NOTE — Telephone Encounter (Signed)
Let me re phrase. Nebulizer isn't necessary. Will not prescribe unnecessary meds or DME. Inhaler is sufficient

## 2020-07-28 NOTE — Telephone Encounter (Signed)
Patient advised as below. Patient reports he is willing to pay out of pocket for nebulizer. Patient requesting order for nebulizer and medication be sent to [facility] on [address]. Please advise.

## 2020-07-29 NOTE — Telephone Encounter (Signed)
Patient reports he will call back if not feeling any better to be revaluated.

## 2020-07-30 NOTE — Telephone Encounter (Signed)
Patient calling to check the status of the ZIO results  Please call to discuss

## 2020-07-30 NOTE — Telephone Encounter (Signed)
Spoke with the patient. Adv him that his zio is still listed as "in transit" with the usps back to [employer] on the [employer] with the most recent date of 07/24/20. Adv the patient that once it is received by [employer] it will take them 3-5 business days to finalize the report and send it to the provider. Once the provider has the opportunity to review the results and give his recommendation we will call him to discuss. Patient verbalized understanding and voiced appreciation for the update.

## 2020-08-06 ENCOUNTER — Telehealth: Payer: 59 | Admitting: Family Medicine

## 2020-08-06 NOTE — Progress Notes (Unsigned)
    MyChart Video Visit    Virtual Visit via Video Note   This visit type was conducted due to national recommendations for restrictions regarding the COVID-19 Pandemic (e.g. social distancing) in an effort to limit this patient's exposure and mitigate transmission in our community. This patient is at least at moderate risk for complications without adequate follow up. This format is felt to be most appropriate for this patient at this time. Physical exam was limited by quality of the video and audio technology used for the visit.   Patient location: *** Provider location: ***  I discussed the limitations of evaluation and management by telemedicine and the availability of in person appointments. The patient expressed understanding and agreed to proceed.  Patient: [name] [name]   DOB: [birth year]   34 y.o. Male  MRN: [id number] Visit Date: 08/06/2020  Today's healthcare provider: Shirlee Latch, MD   No chief complaint on file.  Subjective    HPI  ***  {Show patient history (optional):23778::" "}  Medications: Outpatient Medications Prior to Visit  Medication Sig  . albuterol (VENTOLIN HFA) 108 (90 Base) MCG/ACT inhaler Inhale 2 puffs into the lungs every 6 (six) hours as needed for wheezing or shortness of breath.  . ALPRAZolam (XANAX) 1 MG tablet Take 1 tablet (1 mg total) by mouth daily as needed.  [name] omeprazole (PRILOSEC) 20 MG capsule Take 1 capsule (20 mg total) by mouth daily.  . ondansetron (ZOFRAN) 4 MG tablet Take 1 tablet (4 mg total) by mouth every 8 (eight) hours as needed for nausea or vomiting.   No facility-administered medications prior to visit.    Review of Systems  {Labs  Heme  Chem  Endocrine  Serology  Results Review (optional):23779::" "}  Objective    There were no vitals taken for this visit. {Show previous vital signs (optional):23777::" "}  Physical Exam     Assessment & Plan     ***  No follow-ups on file.     I discussed the  assessment and treatment plan with the patient. The patient was provided an opportunity to ask questions and all were answered. The patient agreed with the plan and demonstrated an understanding of the instructions.   The patient was advised to call back or seek an in-person evaluation if the symptoms worsen or if the condition fails to improve as anticipated.  I provided *** minutes of non-face-to-face time during this encounter.  {provider attestation***:1}  Shirlee Latch, MD [facility] [phone] (phone) [phone] (fax)  [facility]

## 2020-08-06 NOTE — Telephone Encounter (Signed)
The monitor was ordered by Eula Listen, PA. As of today on the zio suites website the tracking still list the monitor as "in transit"

## 2020-08-06 NOTE — Telephone Encounter (Signed)
Patient calling in to check on results from zio Please advise

## 2020-08-06 NOTE — Telephone Encounter (Signed)
Spoke with patient and advised we are waiting on monitor results but that we will call once those results come in. Let him know that I would check with monitor company to determine status as well. He verbalized understanding with no further questions at this time.

## 2020-08-06 NOTE — Telephone Encounter (Signed)
Spoke with [name] [name] at [facility] regarding monitor and reviewed tracking information showing that it is still in transit. They will expedite monitor once it is received which should be by Monday or latest Tuesday. Will update provider to make him aware as well.

## 2020-08-11 NOTE — Telephone Encounter (Signed)
Spoke to pt. Notified zio report has been finalized and has been sent to the provider for review. I did discuss with pt preliminary report and that the provider will review for final interpretation. Pt appreciative and verbalized understanding. Pt also will follow up in office on 2/16.

## 2020-08-11 NOTE — Telephone Encounter (Signed)
Patient calling for status on zio report .  Please call with update .

## 2020-08-13 ENCOUNTER — Encounter: Payer: Self-pay | Admitting: Adult Health

## 2020-08-13 ENCOUNTER — Ambulatory Visit (INDEPENDENT_AMBULATORY_CARE_PROVIDER_SITE_OTHER): Payer: 59 | Admitting: Adult Health

## 2020-08-13 ENCOUNTER — Ambulatory Visit
Admission: RE | Admit: 2020-08-13 | Discharge: 2020-08-13 | Disposition: A | Discharge status: Home / Self Care | Payer: 59 | Source: Ambulatory Visit | Attending: Adult Health | Admitting: Adult Health

## 2020-08-13 ENCOUNTER — Other Ambulatory Visit: Payer: Self-pay

## 2020-08-13 VITALS — BP 126/87 | HR 86 | Temp 98.3°F | Resp 16 | Wt 209.8 lb

## 2020-08-13 DIAGNOSIS — F419 Anxiety disorder, unspecified: Secondary | ICD-10-CM

## 2020-08-13 DIAGNOSIS — R1011 Right upper quadrant pain: Secondary | ICD-10-CM | POA: Diagnosis present

## 2020-08-13 DIAGNOSIS — R059 Cough, unspecified: Secondary | ICD-10-CM

## 2020-08-13 DIAGNOSIS — R202 Paresthesia of skin: Secondary | ICD-10-CM | POA: Diagnosis not present

## 2020-08-13 DIAGNOSIS — B353 Tinea pedis: Secondary | ICD-10-CM

## 2020-08-13 DIAGNOSIS — Z8616 Personal history of COVID-19: Secondary | ICD-10-CM

## 2020-08-13 DIAGNOSIS — H6983 Other specified disorders of Eustachian tube, bilateral: Secondary | ICD-10-CM | POA: Diagnosis not present

## 2020-08-13 DIAGNOSIS — W57XXXS Bitten or stung by nonvenomous insect and other nonvenomous arthropods, sequela: Secondary | ICD-10-CM

## 2020-08-13 DIAGNOSIS — H6502 Acute serous otitis media, left ear: Secondary | ICD-10-CM

## 2020-08-13 DIAGNOSIS — E559 Vitamin D deficiency, unspecified: Secondary | ICD-10-CM

## 2020-08-13 MED ORDER — AMOXICILLIN-POT CLAVULANATE 875-125 MG PO TABS: 1.0000 | ORAL_TABLET | Freq: Two times a day (BID) | ORAL | 0 refills | Status: DC

## 2020-08-13 MED ORDER — LEVOCETIRIZINE DIHYDROCHLORIDE 5 MG PO TABS: 5.0000 mg | ORAL_TABLET | Freq: Every evening | ORAL | 0 refills | Status: DC

## 2020-08-13 MED ORDER — PREDNISONE 10 MG (21) PO TBPK: ORAL_TABLET | ORAL | 0 refills | Status: DC

## 2020-08-13 MED ORDER — KETOCONAZOLE 2 % EX CREA: 1.0000 "application " | TOPICAL_CREAM | Freq: Two times a day (BID) | CUTANEOUS | 1 refills | Status: AC

## 2020-08-13 NOTE — Progress Notes (Signed)
Established patient visit   Patient: [name] [name]   DOB: 1985-08-13   34 y.o. Male  MRN: [id number] Visit Date: 08/13/2020  Today's healthcare provider: Jairo Ben, FNP   Chief Complaint  Patient presents with  . Follow-up  . Jaw Pain    Patient presents in office with complaint of discomfort around his jaw for 30 days.  . Hypertension    Patient reports that blood pressure readings > 145/90   Subjective    HPI HPI    Jaw Pain     Additional comments: Patient presents in office with complaint of discomfort around his jaw for 30 days.          Hypertension    This is a new problem.  Recent episode started 1 to 4 weeks ago.  Agents associated with hypertension include no associated agents.  Anxiety: Present.  Blurred vision: Present.  Chest pain: Absent.  Chest pressure/discomfort: Absent.  Dyspnea: Present.  Headaches: Present.  Lower extremity edema: Absent.  Orthopnea: Absent.  Palpitations: Absent.  Paroxysmal nocturnal dyspnea: Absent.  Syncope: Absent.  The patient exercises daily.  Patient's diet is generally healthy. Additional comments: Patient reports that blood pressure readings > 145/90       Last edited by Fonda Kinder, CMA on 08/13/2020  3:52 PM. (History)      Follow up for dysfunction of both eustachian tubes  The patient was last seen for this 3 weeks ago. Changes made at last visit include patient encouraged to use Flonase regularly  and Singular.Patient reports since last visit he saw ENT who had cleared him and discontinued him off Zyrtec D. He has had some nose bleeds. He is using the lavage system that the ENT gave him to use.   He has tinnitus daily.  He reports fair compliance with treatment. He feels that condition is Worse. He is not having side effects.  He says his ears are constantly feeling full and pressure. He saw Dr Carron Curie for ENT.  He has sinus pressure on right forehead and maxillary.   He reports he has had  bilateral jaw pain that radiates to his ears.  Does notice he has clinched his jaws more lately.   Denies any chest pain. Denies any dental pain.  He has some tachycardia still on I phone watch around 117.  He reports blood pressure at home 145/93 at times.   He has had some dry skin on his hands and feet, not noticably itchy.  He had covid in January he reports.    He has  had some RUQ pain in abdomen. He has noticed this for a month/ takes prilosec regularly.    Cardiology evaluation as follows reviewed. Has follow up with cardiology 08/22/2020. He wore heart monitor for two weeks. He has not heard results yet. Bicuspid aortic valve with mild aortic regurgitation: No murmur auscultated on exam today.  Normal size aortic root, ascending aorta, and aortic arch on echo in 03/2020.  Continued heart healthy diet, cardiovascular exercise, and optimal blood pressure control recommended.  Primary cardiologist plans for repeat echo in 04/2022.  Denies dizziness, lightheadedness, pre syncopal or syncopal episodes.  Patient  denies any fever, body aches,chills, rash, chest pain, shortness of breath, nausea, vomiting, or diarrhea.    -----------------------------------------------------------------------------------------   Patient Active Problem List   Diagnosis Date Noted  . Vitamin D deficiency 08/15/2020  . Paresthesia of both hands 08/13/2020  . Dysfunction of both eustachian tubes  07/17/2020  . Non-restorative sleep 12/20/2017  . Migraine with aura 12/20/2017  . Fatigue 09/19/2017  . Panic attacks 08/18/2017  . GERD (gastroesophageal reflux disease) 06/01/2017  . Anxiety 06/01/2017  . Non-seasonal allergic rhinitis 06/01/2017   Past Medical History:  Diagnosis Date  . Acid reflux   . Allergy   . Anxiety   . Bicuspid aortic valve 04/2020  . Heart murmur    Past Surgical History:  Procedure Laterality Date  . VASECTOMY     Social History   Tobacco Use  . Smoking status:  Never Smoker  . Smokeless tobacco: Current User    Types: Chew  Vaping Use  . Vaping Use: Never used  Substance Use Topics  . Alcohol use: Yes    Alcohol/week: 1.0 - 4.0 standard drink    Types: 1 - 4 Cans of beer per week  . Drug use: No   Social History   Socioeconomic History  . Marital status: Married    Spouse name: Not on file  . Number of children: 3  . Years of education: Not on file  . Highest education level: Not on file  Occupational History    Comment: city of [place]  Tobacco Use  . Smoking status: Never Smoker  . Smokeless tobacco: Current User    Types: Chew  Vaping Use  . Vaping Use: Never used  Substance and Sexual Activity  . Alcohol use: Yes    Alcohol/week: 1.0 - 4.0 standard drink    Types: 1 - 4 Cans of beer per week  . Drug use: No  . Sexual activity: Yes    Partners: Female    Birth control/protection: Surgical  Other Topics Concern  . Not on file  Social History Narrative  . Not on file   Social Determinants of Health   Financial Resource Strain: Not on file  Food Insecurity: Not on file  Transportation Needs: Not on file  Physical Activity: Not on file  Stress: Not on file  Social Connections: Not on file  Intimate Partner Violence: Not on file   Family Status  Relation Name Status  . Mother  Alive  . Father  Alive  . Sister  (Not Specified)  . PGM  Deceased  . PGF  Alive  . MGM  Deceased  . Neg Hx  (Not Specified)   Family History  Problem Relation Age of Onset  . Thyroid disease Mother   . Anxiety disorder Mother   . Heart murmur Mother   . Diabetes Father        pre-diabetes  . Hypothyroidism Sister   . Cancer Paternal Grandmother        thinks it was liver and pancreatic  . Diabetes Paternal Grandfather   . Heart Problems Maternal Grandmother   . Prostate cancer Neg Hx   . Kidney cancer Neg Hx   . Bladder Cancer Neg Hx   . Breast cancer Neg Hx   . Colon cancer Neg Hx    Allergies  Allergen Reactions  .  Erythromycin Rash       Medications: Outpatient Medications Prior to Visit  Medication Sig  . albuterol (VENTOLIN HFA) 108 (90 Base) MCG/ACT inhaler Inhale 2 puffs into the lungs every 6 (six) hours as needed for wheezing or shortness of breath.  . ALPRAZolam (XANAX) 1 MG tablet Take 1 tablet (1 mg total) by mouth daily as needed.  [name] omeprazole (PRILOSEC) 20 MG capsule Take 1 capsule (20 mg total) by mouth  daily.  . ondansetron (ZOFRAN) 4 MG tablet Take 1 tablet (4 mg total) by mouth every 8 (eight) hours as needed for nausea or vomiting.   No facility-administered medications prior to visit.    Review of Systems  Last CBC Lab Results  Component Value Date   WBC 5.8 08/14/2020   HGB 15.5 08/14/2020   HCT 45.2 08/14/2020   MCV 89 08/14/2020   MCH 30.5 08/14/2020   RDW 12.7 08/14/2020   PLT 198 08/14/2020   Last metabolic panel Lab Results  Component Value Date   GLUCOSE 95 08/14/2020   NA 140 08/14/2020   K 4.3 08/14/2020   CL 103 08/14/2020   CO2 20 08/14/2020   BUN 14 08/14/2020   CREATININE 1.04 08/14/2020   GFRNONAA 95 08/14/2020   GFRAA 109 08/14/2020   CALCIUM 9.7 08/14/2020   PROT 7.3 08/14/2020   ALBUMIN 4.8 08/14/2020   LABGLOB 2.5 08/14/2020   AGRATIO 1.9 08/14/2020   BILITOT 0.3 08/14/2020   ALKPHOS 85 08/14/2020   AST 22 08/14/2020   ALT 31 08/14/2020   ANIONGAP 11 03/04/2020   Last lipids Lab Results  Component Value Date   CHOL 232 (H) 02/14/2020   HDL 38 (L) 02/14/2020   LDLCALC 148 (H) 02/14/2020   TRIG 250 (H) 02/14/2020   CHOLHDL 6.1 (H) 02/14/2020   Last hemoglobin A1c No results found for: HGBA1C Last thyroid functions Lab Results  Component Value Date   TSH 1.590 08/14/2020   Last vitamin D Lab Results  Component Value Date   VD25OH 23.0 (L) 08/14/2020   Last vitamin B12 and Folate Lab Results  Component Value Date   VITAMINB12 499 08/14/2020       Objective    BP 126/87   Pulse 86   Temp 98.3 F (36.8 C) (Oral)    Resp 16   Wt 209 lb 12.8 oz (95.2 kg)   SpO2 96%   BMI 28.45 kg/m  BP Readings from Last 3 Encounters:  08/13/20 126/87  07/06/20 120/86  04/16/20 122/78   Wt Readings from Last 3 Encounters:  08/13/20 209 lb 12.8 oz (95.2 kg)  07/06/20 207 lb (93.9 kg)  04/16/20 209 lb (94.8 kg)       Physical Exam Vitals and nursing note reviewed.  Constitutional:      Appearance: Normal appearance. He is not ill-appearing or toxic-appearing.  HENT:     Head: Normocephalic and atraumatic.     Comments:  Cobblestoning posterior pharynx; bilateral allergic shiners; bilateral TMs air fluid level clear; bilateral nasal turbinates mild edema erythema clear discharge;  '    Right Ear: Hearing and external ear normal. No drainage, swelling or tenderness. A middle ear effusion is present. Tympanic membrane is erythematous and bulging. Tympanic membrane is not perforated.     Left Ear: External ear normal. A middle ear effusion is present. Tympanic membrane is not perforated or erythematous.     Nose: Nose normal.     Mouth/Throat:     Mouth: Mucous membranes are moist.     Pharynx: No oropharyngeal exudate or posterior oropharyngeal erythema.  Eyes:     General: No scleral icterus.       Right eye: No discharge.        Left eye: No discharge.     Extraocular Movements: Extraocular movements intact.     Conjunctiva/sclera: Conjunctivae normal.  Cardiovascular:     Rate and Rhythm: Normal rate and regular rhythm.     Pulses:  Normal pulses.     Heart sounds: Normal heart sounds. No murmur heard. No friction rub. No gallop.   Pulmonary:     Effort: Pulmonary effort is normal. No respiratory distress.     Breath sounds: Normal breath sounds. No stridor. No wheezing, rhonchi or rales.  Chest:     Chest wall: No tenderness.  Abdominal:     General: There is no distension.     Palpations: Abdomen is soft.     Tenderness: There is no abdominal tenderness.  Musculoskeletal:        General: Normal  range of motion.     Cervical back: Normal range of motion and neck supple.     Right lower leg: No edema.     Left lower leg: No edema.  Skin:    General: Skin is warm.     Findings: No lesion.  Neurological:     Mental Status: He is alert and oriented to person, place, and time.     Motor: No weakness.     Gait: Gait normal.  Psychiatric:        Mood and Affect: Mood normal.        Behavior: Behavior normal.        Thought Content: Thought content normal.        Judgment: Judgment normal.      Results for orders placed or performed in visit on 08/13/20  TSH  Result Value Ref Range   TSH 1.590 0.450 - 4.500 uIU/mL  CBC with Differential/Platelet  Result Value Ref Range   WBC 5.8 3.4 - 10.8 x10E3/uL   RBC 5.08 4.14 - 5.80 x10E6/uL   Hemoglobin 15.5 13.0 - 17.7 g/dL   Hematocrit 16.145.2 09.637.5 - 51.0 %   MCV 89 79 - 97 fL   MCH 30.5 26.6 - 33.0 pg   MCHC 34.3 31.5 - 35.7 g/dL   RDW 04.512.7 40.911.6 - 81.115.4 %   Platelets 198 150 - 450 x10E3/uL   Neutrophils 48 Not Estab. %   Lymphs 41 Not Estab. %   Monocytes 10 Not Estab. %   Eos 1 Not Estab. %   Basos 0 Not Estab. %   Neutrophils Absolute 2.8 1.4 - 7.0 x10E3/uL   Lymphocytes Absolute 2.4 0.7 - 3.1 x10E3/uL   Monocytes Absolute 0.6 0.1 - 0.9 x10E3/uL   EOS (ABSOLUTE) 0.1 0.0 - 0.4 x10E3/uL   Basophils Absolute 0.0 0.0 - 0.2 x10E3/uL   Immature Granulocytes 0 Not Estab. %   Immature Grans (Abs) 0.0 0.0 - 0.1 x10E3/uL  Comprehensive Metabolic Panel (CMET)  Result Value Ref Range   Glucose 95 65 - 99 mg/dL   BUN 14 6 - 20 mg/dL   Creatinine, Ser 9.141.04 0.76 - 1.27 mg/dL   GFR calc non Af Amer 95 >59 mL/min/1.73   GFR calc Af Amer 109 >59 mL/min/1.73   BUN/Creatinine Ratio 13 9 - 20   Sodium 140 134 - 144 mmol/L   Potassium 4.3 3.5 - 5.2 mmol/L   Chloride 103 96 - 106 mmol/L   CO2 20 20 - 29 mmol/L   Calcium 9.7 8.7 - 10.2 mg/dL   Total Protein 7.3 6.0 - 8.5 g/dL   Albumin 4.8 4.0 - 5.0 g/dL   Globulin, Total 2.5 1.5 - 4.5  g/dL   Albumin/Globulin Ratio 1.9 1.2 - 2.2   Bilirubin Total 0.3 0.0 - 1.2 mg/dL   Alkaline Phosphatase 85 44 - 121 IU/L   AST 22 0 - 40  IU/L   ALT 31 0 - 44 IU/L  B12  Result Value Ref Range   Vitamin B-12 499 232 - 1,245 pg/mL  B. burgdorfi antibodies  Result Value Ref Range   Lyme IgG/IgM Ab WILL FOLLOW   VITAMIN D 25 Hydroxy (Vit-D Deficiency, Fractures)  Result Value Ref Range   Vit D, 25-Hydroxy 23.0 (L) 30.0 - 100.0 ng/mL    Assessment & Plan     1. Dysfunction of both eustachian tubes  - amoxicillin-clavulanate (AUGMENTIN) 875-125 MG tablet; Take 1 tablet by mouth 2 (two) times daily.  Dispense: 20 tablet; Refill: 0 - predniSONE (STERAPRED UNI-PAK 21 TAB) 10 MG (21) TBPK tablet; PO: Take 6 tablets on day 1:Take 5 tablets day 2:Take 4 tablets day 3: Take 3 tablets day 4:Take 2 tablets day five: 5 Take 1 tablet day 6  Dispense: 21 tablet; Refill: 0 - levocetirizine (XYZAL) 5 MG tablet; Take 1 tablet (5 mg total) by mouth every evening.  Dispense: 90 tablet; Refill: 0 flonase nasal spray.  2. Paresthesia of both hands Denies cervical pai/ shoulder pain.   3. Paresthesia of both feet Denies any lumbar or leg pain.  - TSH - CBC with Differential/Platelet - Comprehensive Metabolic Panel (CMET) - B12- taking B12 currently.  - B. burgdorfi antibodies  4. Anxiety Could be contributing to symptoms,   5. Tick bite, unspecified site, sequela Will check labs, he reports [place] has been on farm.   6. Tinea pedis of both feet - ketoconazole (NIZORAL) 2 % cream; Apply 1 application topically 2 (two) times daily.  Dispense: 60 g; Refill: 1  7. Cough History ofd covid, rule out pneumonia. Likely post covid long hauler symptom or allergy related as treated above. - DG Chest 2 View  8. Personal history of COVID-19- 06/2020 Noted for accuracy.    9. Vitamin D deficiency  - VITAMIN D 25 Hydroxy (Vit-D Deficiency, Fractures)  10. Non-recurrent acute serous otitis media of left  ear  Meds ordered this encounter  Medications  . amoxicillin-clavulanate (AUGMENTIN) 875-125 MG tablet    Sig: Take 1 tablet by mouth 2 (two) times daily.    Dispense:  20 tablet    Refill:  0  . predniSONE (STERAPRED UNI-PAK 21 TAB) 10 MG (21) TBPK tablet    Sig: PO: Take 6 tablets on day 1:Take 5 tablets day 2:Take 4 tablets day 3: Take 3 tablets day 4:Take 2 tablets day five: 5 Take 1 tablet day 6    Dispense:  21 tablet    Refill:  0  . levocetirizine (XYZAL) 5 MG tablet    Sig: Take 1 tablet (5 mg total) by mouth every evening.    Dispense:  90 tablet    Refill:  0  . ketoconazole (NIZORAL) 2 % cream    Sig: Apply 1 application topically 2 (two) times daily.    Dispense:  60 g    Refill:  1    11. RUQ pain He reports this occasionally, will rule out constipation or stone. He should schedule follow up if this persists or changes at anytime for further work up. Not related to diet or change with any food. Chest x ray to rule out pneumonia.  - DG Abd 1 View  Return in about 2 weeks (around 08/27/2020), or if symptoms worsen or fail to improve, for at any time for any worsening symptoms.     Red Flags discussed. The patient was given clear instructions to go to ER or  return to medical center if any red flags develop, symptoms do not improve, worsen or new problems develop. They verbalized understanding.  The entirety of the information documented in the History of Present Illness, Review of Systems and Physical Exam were personally obtained by me. Portions of this information were initially documented by the CMA and reviewed by me for thoroughness and accuracy.   Addressed acute and or chronic medical problems today requiring 55 minutes reviewing patients medical record,labs, counseling patient regarding patient's conditions, any medications, answering questions regarding health, and coordination of care as needed. After visit summary patient given copy and reviewed.    Jairo Ben, FNP  [facility], [place] [phone] (phone) [phone] (fax)  [facility]

## 2020-08-13 NOTE — Patient Instructions (Signed)
Psyllium oral capsule What is this medicine? PSYLLIUM (SIL i yum) is a bulk-forming fiber laxative. This medicine is used to treat constipation. Increasing fiber in the diet may also help lower cholesterol and promote heart health for some people. This medicine may be used for other purposes; ask your health care provider or pharmacist if you have questions. COMMON BRAND NAME(S): GenFiber, Konsyl, Metamucil, Metamucil MultiHealth, Natural Fiber Laxative, Reguloid What should I tell my health care provider before I take this medicine? They need to know if you have any of these conditions:  blockage in your bowel  difficulty swallowing  inflammatory bowel disease  stomach or intestine problems  sudden change in bowel habits lasting more than 2 weeks  an unusual or allergic reaction to psyllium, other medicines, dyes, or preservatives  pregnant or trying or get pregnant  breast-feeding How should I use this medicine? Take this medicine by mouth with a full glass of water. Follow the directions on the package labeling, or take as directed by your health care professional. Take your medicine at regular intervals. Do not take your medicine more often than directed. Talk to your pediatrician regarding the use of this medicine in children. While this drug may be prescribed for children as young as 70 years of age for selected conditions, precautions do apply. Overdosage: If you think you have taken too much of this medicine contact a poison control center or emergency room at once. NOTE: This medicine is only for you. Do not share this medicine with others. What if I miss a dose? If you miss a dose, take it as soon as you can. If it is almost time for your next dose, take only that dose. Do not take double or extra doses. What may interact with this medicine? Interactions are not expected. Take this product at least 2 hours before or after other medicines. This list may not describe all  possible interactions. Give your health care provider a list of all the medicines, herbs, non-prescription drugs, or dietary supplements you use. Also tell them if you smoke, drink alcohol, or use illegal drugs. Some items may interact with your medicine. What should I watch for while using this medicine? Check with your doctor or health care professional if your symptoms do not start to get better or if they get worse. Stop using this medicine and contact your doctor or health care professional if you have rectal bleeding or if you have to treat your constipation for more than 1 week. These could be signs of a more serious condition. Drink several glasses of water a day while you are taking this medicine. This will help to relieve constipation and prevent dehydration. What side effects may I notice from receiving this medicine? Side effects that you should report to your doctor or health care professional as soon as possible:  allergic reactions like skin rash, itching or hives, swelling of the face, lips, or tongue  breathing problems  chest pain  nausea, vomiting  rectal bleeding  trouble swallowing Side effects that usually do not require medical attention (report to your doctor or health care professional if they continue or are bothersome):  bloating  gas  stomach cramps This list may not describe all possible side effects. Call your doctor for medical advice about side effects. You may report side effects to FDA at 1-800-FDA-1088. Where should I keep my medicine? Keep out of the reach of children. Store at room temperature between 15 and 30 degrees C (59  and 86 degrees F). Protect from moisture. Throw away any unused medicine after the expiration date. NOTE: This sheet is a summary. It may not cover all possible information. If you have questions about this medicine, talk to your doctor, pharmacist, or health care provider.  2021 Elsevier/Gold Standard (2017-11-14  15:56:42) Athlete's Foot  Athlete's foot (tinea pedis) is a fungal infection of the skin on your feet. It often occurs on the skin that is between or underneath your toes. It can also occur on the soles of your feet. Symptoms include itchy or white and flaky areas on the skin. The infection can spread from person to person (is contagious). It can also spread when a person's bare feet come in contact with the fungus on shower floors or on items such as shoes. Follow these instructions at home: Medicines  Apply or take over-the-counter and prescription medicines only as told by your doctor.  Apply your antifungal medicine as told by your doctor. Do not stop using the medicine even if your feet start to get better. Foot care  Do not scratch your feet.  Keep your feet dry: ? Wear cotton or wool socks. Change your socks every day or if they become wet. ? Wear shoes that allow air to move around, such as sandals or canvas tennis shoes.  Wash and dry your feet: ? Every day or as told by your doctor. ? After exercising. ? Including the area between your toes. General instructions  Do not share any of these items that touch your feet: ? Towels. ? Shoes. ? Nail clippers. ? Other personal items.  Protect your feet by wearing sandals in wet areas, such as locker rooms and shared showers.  Keep all follow-up visits as told by your doctor. This is important.  If you have diabetes, keep your blood sugar under control. Contact a doctor if:  You have a fever.  You have swelling, pain, warmth, or redness in your foot.  Your feet are not getting better with treatment.  Your symptoms get worse.  You have new symptoms. Summary  Athlete's foot is a fungal infection of the skin on your feet.  Symptoms include itchy or white and flaky areas on the skin.  Apply your antifungal medicine as told by your doctor.  Keep your feet clean and dry. This information is not intended to replace  advice given to you by your health care provider. Make sure you discuss any questions you have with your health care provider. Document Revised: 02/06/2020 Document Reviewed: 02/06/2020 Elsevier Patient Education  2021 [employer].

## 2020-08-14 NOTE — Progress Notes (Signed)
Chest x ray is within normal limits.

## 2020-08-14 NOTE — Progress Notes (Signed)
Negative for stone that would be seen on x ray no constipation seen.

## 2020-08-15 ENCOUNTER — Other Ambulatory Visit: Payer: Self-pay | Admitting: Adult Health

## 2020-08-15 DIAGNOSIS — E559 Vitamin D deficiency, unspecified: Secondary | ICD-10-CM | POA: Insufficient documentation

## 2020-08-15 DIAGNOSIS — R202 Paresthesia of skin: Secondary | ICD-10-CM

## 2020-08-15 MED ORDER — B-12 500 MCG SL SUBL
1.0000 | SUBLINGUAL_TABLET | Freq: Every day | SUBLINGUAL | 0 refills | Status: DC
Start: 2020-08-15 — End: 2021-02-18

## 2020-08-15 MED ORDER — VITAMIN D (ERGOCALCIFEROL) 1.25 MG (50000 UNIT) PO CAPS: 50000.0000 [IU] | ORAL_CAPSULE | ORAL | 0 refills | Status: DC

## 2020-08-15 NOTE — Progress Notes (Signed)
Cardiology Office Note    Date:  08/19/2020   ID:  [name] [name], DOB [birth year], MRN [id number]  PCP:  [name] Downer, MD  Cardiologist:  [name], MD  Electrophysiologist:  None   Chief Complaint: Follow up  History of Present Illness:   [name] [name] is a 35 y.o. male with history of bicuspid aortic valve with mild regurgitation by echo in 03/2020, HLD, anxiety, and chest pain who presents for follow-up of Zio patch.  He was seen by Dr. [name] as a new patient for ED follow-up of chest pain and shortness of in 03/2020.  At that time he noted intermittent episodes of left-sided chest tightness that radiated to his back and between his shoulder blades occurring both with activity and at rest.  At his ED visit in early 03/2020 high-sensitivity troponin was negative x2, EKG showed no ischemic changes, and chest x-ray was unrevealing.  Subsequent echo on 04/02/2020 showed an EF of 55 to 60%, no regional wall motion abnormalities, no LVH, normal LV diastolic function parameters, normal RV systolic function and ventricular cavity size, bicuspid aortic valve with mild regurgitation and sclerosis without evidence of stenosis, estimated right atrial pressure 3 mmHg, and a normal size and structure aortic root measuring 3.2 cm, ascending aorta 3.1 cm, and aortic arch 2.5 cm.  He was seen in follow-up on 04/16/2020 and was doing well from a cardiac perspective.  He did note that his grandparents had history of valve replacements though he was unsure of the specifics.  He reported one episode of chest discomfort that self resolved after several minutes.  He continued to exercise daily at a high intensity without limitations.  It was noted the previously recommended ETT was postponed due to travel.  He contacted our office in early 06/2020 noting a several day history of palpitations occurring after meals.  Zio patch was recommended with subsequent follow up.  He preferred to check with his insurance  company first and advised [place] he would contact our office if he wanted to proceed with outpatient cardiac monitoring.  He was last seen in the office in 07/2020 noting an approximate 98-month history of tachypalpitations that had been progressively worsening over the preceding several weeks.  At times there was some associated chest pain, dizziness, diaphoresis, and vision disturbance.  No frank syncope.  He had been prescribed as needed Xanax by his PCP though did not notice any improvement in his symptoms.  Zio patch showed a predominant rhythm of sinus with an average heart rate of 86 bpm with a range of 46 to 163 bpm.  Rare PACs, atrial couplets, atrial triplets, and PVCs.  No significant arrhythmias were noted.  Since we last saw him he has been evaluated by his PCP for ongoing cough related to possible Covid illness in 06/2020 with chest x-ray being unrevealing.  He was seen by his PCP on 08/18/2020 for flushing and urticaria with recommendation to discontinue prednisone (was taking this for acute otitis media and eustachian tube dysfunction) for flushing and urticaria felt to likely be post viral.  He comes in today continuing to note intermittent flushing and elevated heart rates in the 100s bpm at rest.  He has not had any further chest discomfort.  He notes his heart rate will appropriately increase with exertion when exercising though it does appear to stay elevated for several hours thereafter.  Home BPs have been frequently running in the 140s systolic on his Omron brachial sphygmomanometer.  BP  readings in various medical offices have been in the 120s to 130s systolic.  He continues to note intermittent headaches and frequent diarrhea which have been present for a couple of years.  He does feel anxious regarding his ongoing symptoms though does not feel like anxiety is the primary driving force.   Labs independently reviewed: 08/2020 - BUN 14, serum renin 1.04, potassium 4.3, BUN 4.8, AST/ALT  normal, Hgb 15.5, PLT 198, TSH normal 03/2020 - HGB 15.9, PLT 188, potassium 4.1, BUN 12, serum creatinine 1.48 02/2020 - albumin 4.8, AST/ALT normal, TC 232, TG 250, HDL 38, LDL 148, TSH normal   Past Medical History:  Diagnosis Date  . Acid reflux   . Allergy   . Anxiety   . Bicuspid aortic valve 04/2020  . Heart murmur     Past Surgical History:  Procedure Laterality Date  . VASECTOMY      Current Medications: Current Meds  Medication Sig  . albuterol (VENTOLIN HFA) 108 (90 Base) MCG/ACT inhaler Inhale 2 puffs into the lungs every 6 (six) hours as needed for wheezing or shortness of breath.  . ALPRAZolam (XANAX) 1 MG tablet Take 1 tablet (1 mg total) by mouth daily as needed.  [name]. amoxicillin-clavulanate (AUGMENTIN) 875-125 MG tablet Take 1 tablet by mouth 2 (two) times daily.  . Cyanocobalamin (B-12) 500 MCG SUBL Place 1 tablet under the tongue daily. Recheck lab in 3 months.  [name]. ketoconazole (NIZORAL) 2 % cream Apply 1 application topically 2 (two) times daily.  [name]. levocetirizine (XYZAL) 5 MG tablet Take 1 tablet (5 mg total) by mouth every evening.  [name]. omeprazole (PRILOSEC) 20 MG capsule Take 1 capsule (20 mg total) by mouth daily.  . Vitamin D, Ergocalciferol, (DRISDOL) 1.25 MG (50000 UNIT) CAPS capsule Take 1 capsule (50,000 Units total) by mouth every 7 (seven) days. (taking one tablet per week) walk in lab in office 1-2 weeks after completing prescription.    Allergies:   Erythromycin   Social History   Socioeconomic History  . Marital status: Married    Spouse name: Not on file  . Number of children: 3  . Years of education: Not on file  . Highest education level: Not on file  Occupational History    Comment: city of   Tobacco Use  . Smoking status: Never Smoker  . Smokeless tobacco: Current User    Types: Chew  Vaping Use  . Vaping Use: Never used  Substance and Sexual Activity  . Alcohol use: Yes    Alcohol/week: 1.0 - 4.0 standard drink    Types:  1 - 4 Cans of beer per week  . Drug use: No  . Sexual activity: Yes    Partners: Female    Birth control/protection: Surgical  Other Topics Concern  . Not on file  Social History Narrative  . Not on file   Social Determinants of Health   Financial Resource Strain: Not on file  Food Insecurity: Not on file  Transportation Needs: Not on file  Physical Activity: Not on file  Stress: Not on file  Social Connections: Not on file     Family History:  The patient's family history includes Anxiety disorder in his mother; Cancer in his paternal grandmother; Diabetes in his father and paternal grandfather; Heart Problems in his maternal grandmother; Heart murmur in his mother; Hypothyroidism in his sister; Thyroid disease in his mother. There is no history of Prostate cancer, Kidney cancer, Bladder Cancer, Breast cancer, or Colon cancer.  ROS:   Review of Systems  Constitutional: Positive for malaise/fatigue. Negative for chills, diaphoresis, fever and weight loss.  HENT: Negative for congestion.   Eyes: Negative for discharge and redness.  Respiratory: Negative for cough, hemoptysis, shortness of breath and wheezing.   Cardiovascular: Positive for palpitations. Negative for chest pain, orthopnea, claudication, leg swelling and PND.  Gastrointestinal: Positive for diarrhea. Negative for abdominal pain, heartburn, nausea and vomiting.  Musculoskeletal: Negative for falls and myalgias.  Skin: Negative for rash.  Neurological: Positive for weakness and headaches. Negative for dizziness, tingling, tremors, sensory change, speech change, focal weakness and loss of consciousness.  Endo/Heme/Allergies: Does not bruise/bleed easily.       Flushing Urticaria  Psychiatric/Behavioral: Negative for substance abuse. The patient is not nervous/anxious.   All other systems reviewed and are negative.    EKGs/Labs/Other Studies Reviewed:    Studies reviewed were summarized above. The additional  studies were reviewed today:  Zio patch 07/2020: Patient had a min HR of 46 bpm, max HR of 163 bpm, and avg HR of 86 bpm. Predominant underlying rhythm was Sinus Rhythm. Isolated SVEs were rare (<1.0%), SVE Couplets were rare (<1.0%), and SVE Triplets were rare (<1.0%). Isolated VEs were rare (<1.0%),  and no VE Couplets or VE Triplets were present.  __________  2D echo 04/02/2020: 1. Left ventricular ejection fraction, by estimation, is 55 to 60%. The  left ventricle has normal function. The left ventricle has no regional  wall motion abnormalities. Left ventricular diastolic parameters were  normal.  2. Right ventricular systolic function is normal. The right ventricular  size is normal.  3. The mitral valve is normal in structure. No evidence of mitral valve  regurgitation. No evidence of mitral stenosis.  4. Bicuspid aortic valve type 1 (raphe/calcification between R-L cusp).  The aortic valve is bicuspid. Aortic valve regurgitation is mild. Mild  aortic valve sclerosis is present, with no evidence of aortic valve  stenosis.  5. The inferior vena cava is normal in size with greater than 50%  respiratory variability, suggesting right atrial pressure of 3 mmHg.   EKG:  EKG is not ordered today given recent Zio patch.  Recent Labs: 08/14/2020: ALT 31; BUN 14; Creatinine, Ser 1.04; Hemoglobin 15.5; Platelets 198; Potassium 4.3; Sodium 140; TSH 1.590  Recent Lipid Panel    Component Value Date/Time   CHOL 232 (H) 02/14/2020 0804   TRIG 250 (H) 02/14/2020 0804   HDL 38 (L) 02/14/2020 0804   CHOLHDL 6.1 (H) 02/14/2020 0804   LDLCALC 148 (H) 02/14/2020 0804    PHYSICAL EXAM:    VS:  BP 124/80   Pulse (!) 109   Ht 6' (1.829 m)   Wt 210 lb (95.3 kg)   BMI 28.48 kg/m   BMI: Body mass index is 28.48 kg/m.  Physical Exam Vitals reviewed.  Constitutional:      Appearance: He is well-developed and well-nourished.  HENT:     Head: Normocephalic and atraumatic.  Eyes:      General:        Right eye: No discharge.        Left eye: No discharge.  Neck:     Vascular: No JVD.  Cardiovascular:     Rate and Rhythm: Normal rate and regular rhythm.     Pulses: No midsystolic click and no opening snap.     Heart sounds: Normal heart sounds, S1 normal and S2 normal. Heart sounds not distant. No murmur heard. No friction rub.  Pulmonary:     Effort: Pulmonary effort is normal. No respiratory distress.     Breath sounds: Normal breath sounds. No decreased breath sounds, wheezing or rales.  Chest:     Chest wall: No tenderness.  Abdominal:     General: There is no distension.     Palpations: Abdomen is soft.     Tenderness: There is no abdominal tenderness.  Musculoskeletal:        General: No edema.     Cervical back: Normal range of motion.  Skin:    General: Skin is warm and dry.     Nails: There is no clubbing or cyanosis.  Neurological:     Mental Status: He is alert and oriented to person, place, and time.  Psychiatric:        Mood and Affect: Mood and affect normal.        Speech: Speech normal.        Behavior: Behavior normal.        Thought Content: Thought content normal.        Judgment: Judgment normal.     Wt Readings from Last 3 Encounters:  08/19/20 210 lb (95.3 kg)  08/18/20 208 lb (94.3 kg)  08/13/20 209 lb 12.8 oz (95.2 kg)     ASSESSMENT & PLAN:   1. Palpitations:  Recent outpatient cardiac monitoring was reassuring with a predominant rhythm of sinus with rare PACs, atrial couplets, atrial triplets, and PVCs.  No sustained arrhythmias or prolonged pauses were noted.  Overall reassuring study.  Review of heart rates throughout the day appear to show appropriate chronotropic response to physical exertion, therefore making inappropriate sinus tachycardia less likely.  He does have a constellation of underlying symptoms including flushing, urticaria, headaches, recurrent diarrhea, elevated BPs at home, and tachypalpitations.  Await  previously recommended cortisol level.  In follow-up if clinically indicated could pursue further testing including plasma renin/aldosterone ratio as well as 24-hour urine  2. Bicuspid aortic valve with mild aortic regurgitation: No murmur noted on exam today.  Normal size aortic root, ascending aorta, and aortic arch noted on echo in 03/2020.  Primary cardiologist plans to repeat echo in the fall 2023.  Continued heart healthy diet, cardiovascular exercise, and optimal blood pressure control are recommended.  3. Precordial pain: Resolved without intervention.  Given resolution of symptoms further testing is deferred at this time.   Disposition: F/u with Dr. [name] or an APP in 6 weeks.   Medication Adjustments/Labs and Tests Ordered: Current medicines are reviewed at length with the patient today.  Concerns regarding medicines are outlined above. Medication changes, Labs and Tests ordered today are summarized above and listed in the Patient Instructions accessible in Encounters.   Signed, Eula Listen, PA-C 08/19/2020 4:51 PM     [facility] [place] [address] [place], [place] [address] [phone]

## 2020-08-15 NOTE — Progress Notes (Signed)
TSH, CBC,CMP within normal limit.  B12 ok at 499, ok to try some sublingual b12 at 500 mcg or less daily over the counter or did try to send to pharmacy, can see if this helps any with paresthesias and fatigue.  Lyme pending results.  Vitamin  D is low, this can contribute to poor sleep and fatigue, will send in prescription for Vitamin D at 50,000 units by mouth once every 7 days/(once weekly) for 12 weeks. Advise recheck lab Vitamin D in 1-2 weeks after completing vitamin d prescription. Lab iis walk in and is closed during lunch during regular office hours. Sent in vitamin d to pharmacy.  Advise recheck vitamin D and B12 in 3 months.  Follow up if symptoms are not improving or persist at anytime.

## 2020-08-16 ENCOUNTER — Encounter: Payer: Self-pay | Admitting: Adult Health

## 2020-08-18 ENCOUNTER — Other Ambulatory Visit: Payer: Self-pay

## 2020-08-18 ENCOUNTER — Encounter: Payer: Self-pay | Admitting: Family Medicine

## 2020-08-18 ENCOUNTER — Telehealth: Payer: Self-pay

## 2020-08-18 ENCOUNTER — Ambulatory Visit: Payer: 59 | Admitting: Family Medicine

## 2020-08-18 VITALS — BP 132/85 | HR 74 | Temp 97.9°F | Wt 208.0 lb

## 2020-08-18 DIAGNOSIS — J3089 Other allergic rhinitis: Secondary | ICD-10-CM

## 2020-08-18 DIAGNOSIS — H6983 Other specified disorders of Eustachian tube, bilateral: Secondary | ICD-10-CM

## 2020-08-18 DIAGNOSIS — H6993 Unspecified Eustachian tube disorder, bilateral: Secondary | ICD-10-CM

## 2020-08-18 DIAGNOSIS — L509 Urticaria, unspecified: Secondary | ICD-10-CM | POA: Diagnosis not present

## 2020-08-18 DIAGNOSIS — R1011 Right upper quadrant pain: Secondary | ICD-10-CM

## 2020-08-18 DIAGNOSIS — R232 Flushing: Secondary | ICD-10-CM | POA: Diagnosis not present

## 2020-08-18 LAB — COMPREHENSIVE METABOLIC PANEL
ALT: 31 IU/L (ref 0–44)
AST: 22 IU/L (ref 0–40)
Albumin/Globulin Ratio: 1.9 (ref 1.2–2.2)
Albumin: 4.8 g/dL (ref 4.0–5.0)
Alkaline Phosphatase: 85 IU/L (ref 44–121)
BUN/Creatinine Ratio: 13 (ref 9–20)
BUN: 14 mg/dL (ref 6–20)
Bilirubin Total: 0.3 mg/dL (ref 0.0–1.2)
CO2: 20 mmol/L (ref 20–29)
Calcium: 9.7 mg/dL (ref 8.7–10.2)
Chloride: 103 mmol/L (ref 96–106)
Creatinine, Ser: 1.04 mg/dL (ref 0.76–1.27)
GFR calc Af Amer: 109 mL/min/{1.73_m2} (ref >59)
GFR calc non Af Amer: 95 mL/min/{1.73_m2} (ref >59)
Globulin, Total: 2.5 g/dL (ref 1.5–4.5)
Glucose: 95 mg/dL (ref 65–99)
Potassium: 4.3 mmol/L (ref 3.5–5.2)
Sodium: 140 mmol/L (ref 134–144)
Total Protein: 7.3 g/dL (ref 6.0–8.5)

## 2020-08-18 LAB — CBC WITH DIFFERENTIAL/PLATELET
Basophils Absolute: 0 10*3/uL (ref 0.0–0.2)
Basos: 0 %
EOS (ABSOLUTE): 0.1 10*3/uL (ref 0.0–0.4)
Eos: 1 %
Hematocrit: 45.2 % (ref 37.5–51.0)
Hemoglobin: 15.5 g/dL (ref 13.0–17.7)
Immature Grans (Abs): 0 10*3/uL (ref 0.0–0.1)
Immature Granulocytes: 0 %
Lymphocytes Absolute: 2.4 10*3/uL (ref 0.7–3.1)
Lymphs: 41 %
MCH: 30.5 pg (ref 26.6–33.0)
MCHC: 34.3 g/dL (ref 31.5–35.7)
MCV: 89 fL (ref 79–97)
Monocytes Absolute: 0.6 10*3/uL (ref 0.1–0.9)
Monocytes: 10 %
Neutrophils Absolute: 2.8 10*3/uL (ref 1.4–7.0)
Neutrophils: 48 %
Platelets: 198 10*3/uL (ref 150–450)
RBC: 5.08 x10E6/uL (ref 4.14–5.80)
RDW: 12.7 % (ref 11.6–15.4)
WBC: 5.8 10*3/uL (ref 3.4–10.8)

## 2020-08-18 LAB — VITAMIN D 25 HYDROXY (VIT D DEFICIENCY, FRACTURES): Vit D, 25-Hydroxy: 23 ng/mL — ABNORMAL LOW (ref 30.0–100.0)

## 2020-08-18 LAB — TSH: TSH: 1.59 u[IU]/mL (ref 0.450–4.500)

## 2020-08-18 LAB — VITAMIN B12: Vitamin B-12: 499 pg/mL (ref 232–1245)

## 2020-08-18 LAB — B. BURGDORFI ANTIBODIES: Lyme IgG/IgM Ab: <0.91 {ISR} (ref 0.00–0.90)

## 2020-08-18 NOTE — Telephone Encounter (Signed)
Just an FYI

## 2020-08-18 NOTE — Telephone Encounter (Signed)
I had a cancellation for this afternoon and called pt to ask if he wanted this appt. Pt said yes and he is scheduled for 2:40 today.

## 2020-08-18 NOTE — Patient Instructions (Signed)
Hives Hives (urticaria) are itchy, red, swollen areas on the skin. Hives can appear on any part of the body. Hives often fade within 24 hours (acute hives). Sometimes, new hives appear after old ones fade and the cycle can continue for several days or weeks (chronic hives). Hives do not spread from person to person (are not contagious). Hives come from the body's reaction to something a person is allergic to (allergen), something that causes irritation, or various other triggers. When a person is exposed to a trigger, his or her body releases a chemical (histamine) that causes redness, itching, and swelling. Hives can appear right after exposure to a trigger or hours later. What are the causes? This condition may be caused by:  Allergies to foods or ingredients.  Insect bites or stings.  Exposure to pollen or pets.  Contact with latex or chemicals.  Spending time in sunlight, heat, or cold (exposure).  Exercise.  Stress.  Certain medicines. You can also get hives from other medical conditions and treatments, such as:  Viruses, including the common cold.  Bacterial infections, such as urinary tract infections and strep throat.  Certain medicines.  Allergy shots.  Blood transfusions. Sometimes, the cause of this condition is not known (idiopathic hives). What increases the risk? You are more likely to develop this condition if you:  Are a woman.  Have food allergies, especially to citrus fruits, milk, eggs, peanuts, tree nuts, or shellfish.  Are allergic to: ? Medicines. ? Latex. ? Insects. ? Animals. ? Pollen. What are the signs or symptoms? Common symptoms of this condition include raised, itchy, red or white bumps or patches on your skin. These areas may:  Become large and swollen (welts).  Change in shape and location, quickly and repeatedly.  Be separate hives or connect over a large area of skin.  Sting or become painful.  Turn white when pressed in the  center (blanch). In severe cases, yourhands, feet, and face may also become swollen. This may occur if hives develop deeper in your skin.   How is this diagnosed? This condition may be diagnosed by your symptoms, medical history, and physical exam.  Your skin, urine, or blood may be tested to find out what is causing your hives and to rule out other health issues.  Your health care provider may also remove a small sample of skin from the affected area and examine it under a microscope (biopsy). How is this treated? Treatment for this condition depends on the cause and severity of your symptoms. Your health care provider may recommend using cool, wet cloths (cool compresses) or taking cool showers to relieve itching. Treatment may include:  Medicines that help: ? Relieve itching (antihistamines). ? Reduce swelling (corticosteroids). ? Treat infection (antibiotics).  An injectable medicine (omalizumab). Your health care provider may prescribe this if you have chronic idiopathic hives and you continue to have symptoms even after treatment with antihistamines. Severe cases may require an emergency injection of adrenaline (epinephrine) to prevent a life-threatening allergic reaction (anaphylaxis). Follow these instructions at home: Medicines  Take and apply over-the-counter and prescription medicines only as told by your health care provider.  If you were prescribed an antibiotic medicine, take it as told by your health care provider. Do not stop using the antibiotic even if you start to feel better. Skin care  Apply cool compresses to the affected areas.  Do not scratch or rub your skin. General instructions  Do not take hot showers or baths. This can   make itching worse.  Do not wear tight-fitting clothing.  Use sunscreen and wear protective clothing when you are outside.  Avoid any substances that cause your hives. Keep a journal to help track what causes your hives. Write  down: ? What medicines you take. ? What you eat and drink. ? What products you use on your skin.  Keep all follow-up visits as told by your health care provider. This is important. Contact a health care provider if:  Your symptoms are not controlled with medicine.  Your joints are painful or swollen. Get help right away if:  You have a fever.  You have pain in your abdomen.  Your tongue or lips are swollen.  Your eyelids are swollen.  Your chest or throat feels tight.  You have trouble breathing or swallowing. These symptoms may represent a serious problem that is an emergency. Do not wait to see if the symptoms will go away. Get medical help right away. Call your local emergency services (911 in the [place] Do not drive yourself to the hospital. Summary  Hives (urticaria) are itchy, red, swollen areas on your skin. Hives come from the body's reaction to something a person is allergic to (allergen), something that causes irritation, or various other triggers.  Treatment for this condition depends on the cause and severity of your symptoms.  Avoid any substances that cause your hives. Keep a journal to help track what causes your hives.  Take and apply over-the-counter and prescription medicines only as told by your health care provider.  Keep all follow-up visits as told by your health care provider. This is important. This information is not intended to replace advice given to you by your health care provider. Make sure you discuss any questions you have with your health care provider. Document Revised: 01/03/2018 Document Reviewed: 01/03/2018 [employer]vier Patient Education  2021 [employer]  

## 2020-08-18 NOTE — Progress Notes (Signed)
Acute Office Visit  Subjective:    Patient ID: [name] [name], male    DOB: 1986/05/19, 35 y.o.   MRN: [id number]  Chief Complaint  Patient presents with  . Urticaria    HPI Patient is in today for flushing of his skin and breaking out in welts. He states this has been an ongoing issue.  However, he notes that over the last week and one half.  It of also of note that he was placed on prednisone about that time that it worsened. His mom is a Engineer, civil (consulting) and suggested a cortisol level. He has read concerning things on [employer] about his symptoms.  He had COVID in late Dec/early Jan and was placed on prednisone and abx for AOM and eustachian tube dysfunction last week.  Past Medical History:  Diagnosis Date  . Acid reflux   . Allergy   . Anxiety   . Bicuspid aortic valve 04/2020  . Heart murmur     Past Surgical History:  Procedure Laterality Date  . VASECTOMY      Family History  Problem Relation Age of Onset  . Thyroid disease Mother   . Anxiety disorder Mother   . Heart murmur Mother   . Diabetes Father        pre-diabetes  . Hypothyroidism Sister   . Cancer Paternal Grandmother        thinks it was liver and pancreatic  . Diabetes Paternal Grandfather   . Heart Problems Maternal Grandmother   . Prostate cancer Neg Hx   . Kidney cancer Neg Hx   . Bladder Cancer Neg Hx   . Breast cancer Neg Hx   . Colon cancer Neg Hx     Social History   Socioeconomic History  . Marital status: Married    Spouse name: Not on file  . Number of children: 3  . Years of education: Not on file  . Highest education level: Not on file  Occupational History    Comment: city of [place]  Tobacco Use  . Smoking status: Never Smoker  . Smokeless tobacco: Current User    Types: Chew  Vaping Use  . Vaping Use: Never used  Substance and Sexual Activity  . Alcohol use: Yes    Alcohol/week: 1.0 - 4.0 standard drink    Types: 1 - 4 Cans of beer per week  . Drug use: No  . Sexual  activity: Yes    Partners: Female    Birth control/protection: Surgical  Other Topics Concern  . Not on file  Social History Narrative  . Not on file   Social Determinants of Health   Financial Resource Strain: Not on file  Food Insecurity: Not on file  Transportation Needs: Not on file  Physical Activity: Not on file  Stress: Not on file  Social Connections: Not on file  Intimate Partner Violence: Not on file    Outpatient Medications Prior to Visit  Medication Sig Dispense Refill  . albuterol (VENTOLIN HFA) 108 (90 Base) MCG/ACT inhaler Inhale 2 puffs into the lungs every 6 (six) hours as needed for wheezing or shortness of breath. 8 g 2  . ALPRAZolam (XANAX) 1 MG tablet Take 1 tablet (1 mg total) by mouth daily as needed. 15 tablet 0  . amoxicillin-clavulanate (AUGMENTIN) 875-125 MG tablet Take 1 tablet by mouth 2 (two) times daily. 20 tablet 0  . Cyanocobalamin (B-12) 500 MCG SUBL Place 1 tablet under the tongue daily. Recheck lab in 3 months.  90 tablet 0  . ketoconazole (NIZORAL) 2 % cream Apply 1 application topically 2 (two) times daily. 60 g 1  . levocetirizine (XYZAL) 5 MG tablet Take 1 tablet (5 mg total) by mouth every evening. 90 tablet 0  . omeprazole (PRILOSEC) 20 MG capsule Take 1 capsule (20 mg total) by mouth daily. 90 capsule 3  . predniSONE (STERAPRED UNI-PAK 21 TAB) 10 MG (21) TBPK tablet PO: Take 6 tablets on day 1:Take 5 tablets day 2:Take 4 tablets day 3: Take 3 tablets day 4:Take 2 tablets day five: 5 Take 1 tablet day 6 21 tablet 0  . Vitamin D, Ergocalciferol, (DRISDOL) 1.25 MG (50000 UNIT) CAPS capsule Take 1 capsule (50,000 Units total) by mouth every 7 (seven) days. (taking one tablet per week) walk in lab in office 1-2 weeks after completing prescription. 12 capsule 0  . ondansetron (ZOFRAN) 4 MG tablet Take 1 tablet (4 mg total) by mouth every 8 (eight) hours as needed for nausea or vomiting. (Patient not taking: Reported on 08/18/2020) 20 tablet 0   No  facility-administered medications prior to visit.    Allergies  Allergen Reactions  . Erythromycin Rash    Review of Systems  Constitutional: Positive for activity change and fatigue. Negative for chills, diaphoresis, fever and unexpected weight change.  HENT: Positive for sinus pressure. Negative for nosebleeds and rhinorrhea.   Eyes: Negative.   Respiratory: Negative.   Cardiovascular: Negative.   Gastrointestinal: Positive for abdominal distention, abdominal pain and diarrhea. Negative for constipation.  Endocrine: Negative.   Genitourinary: Negative.   Musculoskeletal: Negative.   Skin: Positive for color change and rash.  Allergic/Immunologic: Negative.   Neurological: Negative.   Psychiatric/Behavioral: The patient is nervous/anxious.        Objective:    Physical Exam Vitals reviewed.  Constitutional:      General: He is not in acute distress.    Appearance: Normal appearance. He is not diaphoretic.  HENT:     Head: Normocephalic and atraumatic.  Eyes:     General: No scleral icterus.    Conjunctiva/sclera: Conjunctivae normal.  Cardiovascular:     Rate and Rhythm: Normal rate and regular rhythm.     Pulses: Normal pulses.     Heart sounds: Normal heart sounds. No murmur heard.   Pulmonary:     Effort: Pulmonary effort is normal. No respiratory distress.     Breath sounds: Normal breath sounds. No wheezing or rhonchi.  Abdominal:     General: There is no distension.     Palpations: Abdomen is soft. There is no mass.     Tenderness: There is abdominal tenderness in the right upper quadrant. There is no right CVA tenderness, left CVA tenderness, guarding or rebound. Negative signs include Murphy's sign.  Musculoskeletal:     Cervical back: Neck supple.     Right lower leg: No edema.     Left lower leg: No edema.  Lymphadenopathy:     Cervical: No cervical adenopathy.  Skin:    General: Skin is warm and dry.     Capillary Refill: Capillary refill takes  less than 2 seconds.     Findings: No rash.  Neurological:     Mental Status: He is alert and oriented to person, place, and time.     Cranial Nerves: No cranial nerve deficit.  Psychiatric:        Mood and Affect: Affect normal. Mood is anxious.        Behavior: Behavior normal.  BP 132/85 (BP Location: Right Arm, Patient Position: Sitting, Cuff Size: Normal)   Pulse 74   Temp 97.9 F (36.6 C) (Oral)   Wt 208 lb (94.3 kg)   SpO2 97%   BMI 28.21 kg/m  Wt Readings from Last 3 Encounters:  08/18/20 208 lb (94.3 kg)  08/13/20 209 lb 12.8 oz (95.2 kg)  07/06/20 207 lb (93.9 kg)    There are no preventive care reminders to display for this patient.  There are no preventive care reminders to display for this patient.   Lab Results  Component Value Date   TSH 1.590 08/14/2020   Lab Results  Component Value Date   WBC 5.8 08/14/2020   HGB 15.5 08/14/2020   HCT 45.2 08/14/2020   MCV 89 08/14/2020   PLT 198 08/14/2020   Lab Results  Component Value Date   NA 140 08/14/2020   K 4.3 08/14/2020   CO2 20 08/14/2020   GLUCOSE 95 08/14/2020   BUN 14 08/14/2020   CREATININE 1.04 08/14/2020   BILITOT 0.3 08/14/2020   ALKPHOS 85 08/14/2020   AST 22 08/14/2020   ALT 31 08/14/2020   PROT 7.3 08/14/2020   ALBUMIN 4.8 08/14/2020   CALCIUM 9.7 08/14/2020   ANIONGAP 11 03/04/2020   Lab Results  Component Value Date   CHOL 232 (H) 02/14/2020   Lab Results  Component Value Date   HDL 38 (L) 02/14/2020   Lab Results  Component Value Date   LDLCALC 148 (H) 02/14/2020   Lab Results  Component Value Date   TRIG 250 (H) 02/14/2020   Lab Results  Component Value Date   CHOLHDL 6.1 (H) 02/14/2020   No results found for: HGBA1C     Assessment & Plan:   1. Flushing - likely resultant of recent COVID 19 infection and now prednisone use - recommend stopping prednisone - discussed that could check AM cortisol if still occurring in 1 month (need to get off prednisone  before checking)  2. Urticaria - likely post-viral - advised H1 and H2 blocker  3. Non-seasonal allergic rhinitis, unspecified trigger 4. Dysfunction of both eustachian tubes - continue antihistamine and flonase - discussed natural course and that it is not concerning that it is not better yet after 1 week - no indication for prednisone at this time and will d/c  5. RUQ pain - new problem x few weeks - no fever or systemic symptoms to suggest cholecystitis -Had abdominal x-ray is negative for constipation or kidney stone -No urinary symptoms to suggest nephrolithiasis -Needs right upper quadrant ultrasound to evaluate gallbladder -Advised to monitor symptoms to find triggers and make notes on this -Further evaluation/management after ultrasound results - US ABDOMEN LIMITED RUQ (LIVER/GB); Future    Return in about 4 weeks (around 09/15/2020) for chronic disease f/u.    I, Shirlee Latch, MD, have reviewed all documentation for this visit. The documentation on 08/19/20 for the exam, diagnosis, procedures, and orders are all accurate and complete.   Nayelis Bonito, Marzella Schlein, MD, MPH [facility]

## 2020-08-18 NOTE — Telephone Encounter (Signed)
Copied from CRM [id number]. Topic: Appointment Scheduling - Scheduling Inquiry for Clinic >> Aug 18, 2020  8:26 AM Crist Infante wrote: Reason for CRM: pt states his sx are progressing and the flushing and itching of the face, shoulders, and upper chest seems to be getting more frequent.  Seems to occur more in the am.  Pt states he can run his finger across his chest and a "whelp" may appear.  Pt seen Marcelino Duster last week.  He sent a mychart message and was advised to call office and make appt with Dr B.  That message made him think there is an appt today.  But Dr B is booked until 2/18 Pt would like to be worked in with his dr asap to discuss these ongoing issues.  Pt hopes to hear back today please.

## 2020-08-19 ENCOUNTER — Encounter: Payer: Self-pay | Admitting: Physician Assistant

## 2020-08-19 ENCOUNTER — Ambulatory Visit (INDEPENDENT_AMBULATORY_CARE_PROVIDER_SITE_OTHER): Payer: 59 | Admitting: Physician Assistant

## 2020-08-19 VITALS — BP 124/80 | HR 109 | Ht 72.0 in | Wt 210.0 lb

## 2020-08-19 DIAGNOSIS — R002 Palpitations: Secondary | ICD-10-CM | POA: Diagnosis not present

## 2020-08-19 DIAGNOSIS — Q231 Congenital insufficiency of aortic valve: Secondary | ICD-10-CM | POA: Diagnosis not present

## 2020-08-19 DIAGNOSIS — I351 Nonrheumatic aortic (valve) insufficiency: Secondary | ICD-10-CM | POA: Diagnosis not present

## 2020-08-19 DIAGNOSIS — R072 Precordial pain: Secondary | ICD-10-CM

## 2020-08-19 NOTE — Patient Instructions (Signed)
Medication Instructions:  Your physician recommends that you continue on your current medications as directed. Please refer to the Current Medication list given to you today.  *If you need a refill on your cardiac medications before your next appointment, please call your pharmacy*   Lab Work: None ordered If you have labs (blood work) drawn today and your tests are completely normal, you will receive your results only by: [name] MyChart Message (if you have MyChart) OR . A paper copy in the mail If you have any lab test that is abnormal or we need to change your treatment, we will call you to review the results.   Testing/Procedures: None ordered   Follow-Up: At [facility], you and your health needs are our priority.  As part of our continuing mission to provide you with exceptional heart care, we have created designated Provider Care Teams.  These Care Teams include your primary Cardiologist (physician) and Advanced Practice Providers (APPs -  Physician Assistants and Nurse Practitioners) who all work together to provide you with the care you need, when you need it.  We recommend signing up for the patient portal called "MyChart".  Sign up information is provided on this After Visit Summary.  MyChart is used to connect with patients for Virtual Visits (Telemedicine).  Patients are able to view lab/test results, encounter notes, upcoming appointments, etc.  Non-urgent messages can be sent to your provider as well.   To learn more about what you can do with MyChart, go to [email or web address].    Your next appointment:   6 week(s)  The format for your next appointment:   In Person  Provider:   You may see [name], MD or one of the following Advanced Practice Providers on your designated Care Team:    Nicolasa Ducking, NP  Eula Listen, PA-C  Marisue Ivan, PA-C  Cadence Bejou, [place]  Gillian Shields, NP    Other Instructions N/A

## 2020-08-21 ENCOUNTER — Ambulatory Visit: Payer: 59 | Admitting: Family Medicine

## 2020-08-25 ENCOUNTER — Emergency Department
Admission: EM | Admit: 2020-08-25 | Discharge: 2020-08-26 | Disposition: A | Discharge status: Home / Self Care | Payer: 59 | Attending: Emergency Medicine | Admitting: Emergency Medicine

## 2020-08-25 ENCOUNTER — Emergency Department: Payer: 59

## 2020-08-25 ENCOUNTER — Other Ambulatory Visit: Payer: Self-pay

## 2020-08-25 DIAGNOSIS — F419 Anxiety disorder, unspecified: Secondary | ICD-10-CM | POA: Insufficient documentation

## 2020-08-25 DIAGNOSIS — R55 Syncope and collapse: Secondary | ICD-10-CM

## 2020-08-25 DIAGNOSIS — R002 Palpitations: Secondary | ICD-10-CM | POA: Diagnosis not present

## 2020-08-25 LAB — COMPREHENSIVE METABOLIC PANEL
ALT: 48 U/L — ABNORMAL HIGH (ref 0–44)
AST: 25 U/L (ref 15–41)
Albumin: 4.5 g/dL (ref 3.5–5.0)
Alkaline Phosphatase: 71 U/L (ref 38–126)
Anion gap: 8 (ref 5–15)
BUN: 15 mg/dL (ref 6–20)
CO2: 25 mmol/L (ref 22–32)
Calcium: 9.5 mg/dL (ref 8.9–10.3)
Chloride: 104 mmol/L (ref 98–111)
Creatinine, Ser: 1.25 mg/dL — ABNORMAL HIGH (ref 0.61–1.24)
GFR, Estimated: >60 mL/min (ref >60)
Glucose, Bld: 103 mg/dL — ABNORMAL HIGH (ref 70–99)
Potassium: 3.7 mmol/L (ref 3.5–5.1)
Sodium: 137 mmol/L (ref 135–145)
Total Bilirubin: 0.9 mg/dL (ref 0.3–1.2)
Total Protein: 7.5 g/dL (ref 6.5–8.1)

## 2020-08-25 LAB — CBC WITH DIFFERENTIAL/PLATELET
Abs Immature Granulocytes: 0.04 10*3/uL (ref 0.00–0.07)
Basophils Absolute: 0 10*3/uL (ref 0.0–0.1)
Basophils Relative: 0 %
Eosinophils Absolute: 0.1 10*3/uL (ref 0.0–0.5)
Eosinophils Relative: 1 %
HCT: 42.3 % (ref 39.0–52.0)
Hemoglobin: 14.7 g/dL (ref 13.0–17.0)
Immature Granulocytes: 0 %
Lymphocytes Relative: 34 %
Lymphs Abs: 3.1 10*3/uL (ref 0.7–4.0)
MCH: 30.2 pg (ref 26.0–34.0)
MCHC: 34.8 g/dL (ref 30.0–36.0)
MCV: 87 fL (ref 80.0–100.0)
Monocytes Absolute: 0.7 10*3/uL (ref 0.1–1.0)
Monocytes Relative: 8 %
Neutro Abs: 5.2 10*3/uL (ref 1.7–7.7)
Neutrophils Relative %: 57 %
Platelets: 162 10*3/uL (ref 150–400)
RBC: 4.86 MIL/uL (ref 4.22–5.81)
RDW: 12.2 % (ref 11.5–15.5)
WBC: 9.2 10*3/uL (ref 4.0–10.5)
nRBC: 0 % (ref 0.0–0.2)

## 2020-08-25 NOTE — ED Triage Notes (Signed)
[name], per wife pt had witnessed full-body seizure lasting about 3 minutes on the couch while watching tv. No seizure history. Pt alert and oriented at this time. Complaint of neck discomfort at this time.

## 2020-08-25 NOTE — ED Notes (Signed)
Patient transported to CT 

## 2020-08-25 NOTE — ED Provider Notes (Signed)
[facility] Emergency Department Provider Note ____________________________________________   Event Date/Time   First MD Initiated Contact with Patient 08/25/20 2302     (approximate)  I have reviewed the triage vital signs and the nursing notes.  HISTORY  Chief Complaint Seizures   HPI [name] [name] is a 35 y.o. malewho presents to the ED for evaluation of seizure-like activity.  Chart review indicates history of bicuspid aortic valve, HLD, anxiety. Recent outpatient cardiology establishment due to chest pain, palpitations.  Normal echo, with bicuspid aortic valve and mild regurg, 5 months ago.  ZIO Patch with sinus rhythm and no significant dysrhythmias. Normal TSH.   Patient denies to the ED for evaluation of a witnessed syncopal episode.  Patient presents with his wife, who was with him this evening, and witnessed a syncopal episode.  Patient reports subacute-chronic symptoms of palpitations, "I know something is wrong with my body but do not know what," intermittent lightheadedness and blurry vision, and a flushing sensation to his upper chest and neck.  He denies any syncopal episodes prior to this evening, any chest pain, neck pain, headache cough, fever, emesis, abdominal pain or diarrhea.  He reports tolerating p.o. intake and toileting at his baseline.  Patient reports "having a bad day" regarding acute on subacute anxiety, palpitations and knowing that something is wrong. He reports a "bad episode" of flushing that occurred today. He reports taking his prescribed as needed alprazolam earlier in the day.  He reports this evening texting his wife, who was upstairs, that he was concerned that something was wrong.  She reports coming down the stairs and finding him hyperventilating and "looked like he was having a panic attack."  She reports catching him as he passed out. She reports that he was unconscious for about 3 minutes prior to returning to baseline.  No tongue biting, incontinence of stool or urine, or discrete seizure activity. Wife reports that he was finally tremulous throughout. Patient has had no syncopal episodes since that time. He denies any headache, chest pain, back pain or neck pain.   Past Medical History:  Diagnosis Date  . Acid reflux   . Allergy   . Anxiety   . Bicuspid aortic valve 04/2020  . Heart murmur     Patient Active Problem List   Diagnosis Date Noted  . Flushing 08/18/2020  . Urticaria 08/18/2020  . Vitamin D deficiency 08/15/2020  . Paresthesia of both hands 08/13/2020  . Dysfunction of both eustachian tubes 07/17/2020  . Non-restorative sleep 12/20/2017  . Migraine with aura 12/20/2017  . Fatigue 09/19/2017  . Panic attacks 08/18/2017  . GERD (gastroesophageal reflux disease) 06/01/2017  . Anxiety 06/01/2017  . Non-seasonal allergic rhinitis 06/01/2017    Past Surgical History:  Procedure Laterality Date  . VASECTOMY      Prior to Admission medications   Medication Sig Start Date End Date Taking? Authorizing Provider  albuterol (VENTOLIN HFA) 108 (90 Base) MCG/ACT inhaler Inhale 2 puffs into the lungs every 6 (six) hours as needed for wheezing or shortness of breath. 07/27/20   Bacigalupo, Marzella Schlein, MD  ALPRAZolam Prudy Feeler) 1 MG tablet Take 1 tablet (1 mg total) by mouth daily as needed. 07/17/20   [name], MD  amoxicillin-clavulanate (AUGMENTIN) 875-125 MG tablet Take 1 tablet by mouth 2 (two) times daily. 08/13/20   Flinchum, Eula Fried, FNP  Cyanocobalamin (B-12) 500 MCG SUBL Place 1 tablet under the tongue daily. Recheck lab in 3 months. 08/15/20  Flinchum, Eula Fried, FNP  ketoconazole (NIZORAL) 2 % cream Apply 1 application topically 2 (two) times daily. 08/13/20   Flinchum, Eula Fried, FNP  levocetirizine (XYZAL) 5 MG tablet Take 1 tablet (5 mg total) by mouth every evening. 08/13/20   Flinchum, Eula Fried, FNP  omeprazole (PRILOSEC) 20 MG capsule Take 1 capsule (20 mg total) by  mouth daily. 02/13/20   Trey Sailors, PA-C  Vitamin D, Ergocalciferol, (DRISDOL) 1.25 MG (50000 UNIT) CAPS capsule Take 1 capsule (50,000 Units total) by mouth every 7 (seven) days. (taking one tablet per week) walk in lab in office 1-2 weeks after completing prescription. 08/15/20   Flinchum, Eula Fried, FNP    Allergies Erythromycin  Family History  Problem Relation Age of Onset  . Thyroid disease Mother   . Anxiety disorder Mother   . Heart murmur Mother   . Diabetes Father        pre-diabetes  . Hypothyroidism Sister   . Cancer Paternal Grandmother        thinks it was liver and pancreatic  . Diabetes Paternal Grandfather   . Heart Problems Maternal Grandmother   . Prostate cancer Neg Hx   . Kidney cancer Neg Hx   . Bladder Cancer Neg Hx   . Breast cancer Neg Hx   . Colon cancer Neg Hx     Social History Social History   Tobacco Use  . Smoking status: Never Smoker  . Smokeless tobacco: Current User    Types: Chew  Vaping Use  . Vaping Use: Never used  Substance Use Topics  . Alcohol use: Yes    Alcohol/week: 1.0 - 4.0 standard drink    Types: 1 - 4 Cans of beer per week  . Drug use: No    Review of Systems  Constitutional: No fever/chills. Positive for generalized weakness and a witnessed syncopal episode. Eyes: No visual changes. ENT: No sore throat. Cardiovascular: Denies chest pain. Respiratory: Denies shortness of breath. Gastrointestinal: No abdominal pain.  No nausea, no vomiting.  No diarrhea.  No constipation. Genitourinary: Negative for dysuria. Musculoskeletal: Negative for back pain. Skin: Negative for rash. Positive for flushing sensation. Neurological: Negative for headaches, focal weakness or numbness.  ____________________________________________   PHYSICAL EXAM:  VITAL SIGNS: Vitals:   08/26/20 0010 08/26/20 0015  BP: (!) 142/97   Pulse: 84 86  Resp: 12 17  Temp:    SpO2: 98% 97%    Constitutional: Alert and oriented. Well  appearing and in no acute distress. Stands and ambulates with a normal gait independently. Conversational in full sentences. Eyes: Conjunctivae are normal. PERRL. EOMI. Head: Atraumatic. Nose: No congestion/rhinnorhea. Mouth/Throat: Mucous membranes are moist.  Oropharynx non-erythematous. Neck: No stridor. No cervical spine tenderness to palpation. Cardiovascular: Normal rate, regular rhythm. Grossly normal heart sounds.  Good peripheral circulation. Respiratory: Normal respiratory effort.  No retractions. Lungs CTAB. Gastrointestinal: Soft , nondistended, nontender to palpation. No CVA tenderness. Musculoskeletal: No lower extremity tenderness nor edema.  No joint effusions. No signs of acute trauma. Neurologic:  Normal speech and language. No gross focal neurologic deficits are appreciated. No gait instability noted. Cranial nerves II through XII intact 5/5 strength and sensation in all 4 extremities Skin:  Skin is warm, dry and intact. No rash noted. Psychiatric: Mood and affect are normal. Speech and behavior are normal.  ____________________________________________   LABS (all labs ordered are listed, but only abnormal results are displayed)  Labs Reviewed  COMPREHENSIVE METABOLIC PANEL - Abnormal; Notable for the  following components:      Result Value   Glucose, Bld 103 (*)    Creatinine, Ser 1.25 (*)    ALT 48 (*)    All other components within normal limits  CBC WITH DIFFERENTIAL/PLATELET   ____________________________________________  12 Lead EKG  Sinus rhythm, rate of 73 bpm. Normal axis and intervals. No evidence of acute ischemia. ____________________________________________  RADIOLOGY  ED MD interpretation: CT head reviewed by me without evidence of acute intracranial pathology.  Official radiology report(s): CT Head Wo Contrast  Result Date: 08/25/2020 CLINICAL DATA:  Mental status change seizure EXAM: CT HEAD WITHOUT CONTRAST TECHNIQUE: Contiguous axial  images were obtained from the base of the skull through the vertex without intravenous contrast. COMPARISON:  CT 02/27/2007 FINDINGS: Brain: No evidence of acute infarction, hemorrhage, hydrocephalus, extra-axial collection or mass lesion/mass effect. Vascular: No hyperdense vessel or unexpected calcification. Skull: Normal. Negative for fracture or focal lesion. Sinuses/Orbits: No acute finding. Other: None IMPRESSION: Negative non contrasted CT appearance of the brain. Electronically Signed   By: Jasmine Pang M.D.   On: 08/25/2020 23:34    ____________________________________________   PROCEDURES and INTERVENTIONS  Procedure(s) performed (including Critical Care):  .1-3 Lead EKG Interpretation Performed by: Delton Prairie, MD Authorized by: Delton Prairie, MD     Interpretation: normal     ECG rate:  80   ECG rate assessment: normal     Rhythm: sinus rhythm     Ectopy: none     Conduction: normal      Medications - No data to display  ____________________________________________   MDM / ED COURSE   35 year old male presents to the ED after witnessed syncopal episode, possibly due to hyperventilation and anxiety, and without evidence of significant acute pathology, and ultimately amenable to outpatient management. Normal vitals on room air. Exam without evidence of acute derangements. No evidence of trauma from the syncopal episode today, no distress and no neurovascular deficits. He is ambulatory without distress, and with a normal gait independently. Blood work demonstrates normal electrolytes and marginally elevated creatinine at his baseline from recent tests as an outpatient. EKG is nonischemic with normal intervals, without evidence of cardiac etiology of his syncope. CT head demonstrates no ICH, mass or signs of CVA. We discussed multiple possible etiologies of his symptoms and I urged patient follow-up with his PCP to continue endocrine work-up, but I see no evidence of acute  pathology to preclude outpatient management. We discussed return precautions for the ED and patient is stable for discharge.     ____________________________________________   FINAL CLINICAL IMPRESSION(S) / ED DIAGNOSES  Final diagnoses:  Syncope and collapse     ED Discharge Orders    None       Davi Kroon   Note:  This document was prepared using Dragon voice recognition software and may include unintentional dictation errors.   Delton Prairie, MD 08/26/20 [id number]

## 2020-08-26 ENCOUNTER — Telehealth: Payer: Self-pay | Admitting: Physician Assistant

## 2020-08-26 NOTE — Telephone Encounter (Signed)
Spoke with patient and he states that yesterday he had similar episode and went to ED for evaluation. Reports blood pressure of 158/98 and reviewed documented additional BP of 142/97. States that it was a episode of lightheadedness and blurry vision, and a flushing sensation to his upper chest and neck. He reports that his wife did witness a syncopal episode. He states that Alycia Rossetti is the only one that listened to him and showed genuine concern with reports of additional testing if symptoms persist. Patient also states that the documentation from ED was not correct and that documentation listed as negative to some questions and that is not true. He is going to reach out to them on this documentation. Advised that I would send this per his request to [facility] for review and would give him a call back. He verbalized understanding with no further questions at this time.

## 2020-08-26 NOTE — ED Notes (Signed)
This RN entered room to discharge pt, pt states he wants a second opinion and does not believe he is ready for discharge. Smith MD made aware and responded to bedside.

## 2020-08-26 NOTE — Telephone Encounter (Signed)
Patient went to ED last night due to seizure. Patient calling in to make [name] [name] aware as he would like this advice on what to do next  Please advise

## 2020-08-26 NOTE — Discharge Instructions (Signed)
As we discussed, please follow up with your PCP to discuss your continued symptoms.   If you develop any significantly worsening symptoms, please return to the ED.

## 2020-08-27 ENCOUNTER — Ambulatory Visit: Payer: 59 | Admitting: Family Medicine

## 2020-08-27 ENCOUNTER — Other Ambulatory Visit: Payer: Self-pay

## 2020-08-27 VITALS — BP 125/79 | HR 64 | Temp 97.9°F | Ht 72.0 in | Wt 199.0 lb

## 2020-08-27 DIAGNOSIS — H6993 Unspecified Eustachian tube disorder, bilateral: Secondary | ICD-10-CM

## 2020-08-27 DIAGNOSIS — R569 Unspecified convulsions: Secondary | ICD-10-CM

## 2020-08-27 DIAGNOSIS — R232 Flushing: Secondary | ICD-10-CM

## 2020-08-27 DIAGNOSIS — H6983 Other specified disorders of Eustachian tube, bilateral: Secondary | ICD-10-CM

## 2020-08-27 NOTE — Telephone Encounter (Signed)
Spoke with patient. 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  Referral for Dr. Lalla Brothers was entered.  Patient wanted to ask Alycia Rossetti if he wanted to do the urine labs they previously talked about or wait until his follow up appointment on 09/23/20. Will route to Eula Listen, PA for further recommendation.

## 2020-08-27 NOTE — Progress Notes (Signed)
Established patient visit   Patient: [name] [name]   DOB: 17-May-1986   35 y.o. Male  MRN: [id number] Visit Date: 08/27/2020  Today's healthcare provider: Shirlee Latch, MD   Chief Complaint  Patient presents with  . Hospitalization Follow-up   Subjective    HPI  Follow up Hospitalization  Patient was admitted to [facility] emergency room on 08/25/2020 and discharged on 08/26/2020. He was treated for syncope . Treatment for this included no medications. EKG and head CT were performed. Telephone follow up was done on 08/26/2020. He reports N/A compliance with treatment. He reports this condition is resolved.  ----------------------------------------------------------------------------------------- -  States that ED HPI is incorrect   Reports that flushing symptoms and feeling more of balance have been getting worse. Was sitting at the kitchen table (bar height). Wasn't responsing much to wife, but appeared conscious. Slumped into wife. Violently shaking all over and spitting from the mouth.  Seizure activity went on for 3 minutes. Called EMS. Was post-ictal for close to 30-40 minutes. Tongue was sore afterwards. No bowel or bladder incontinence. No recent fever.  No alcohol intake or recent benzo discontinuation (does not take regularly)  Over the last few months, has had episodes of looking off and looking absent.   Social History   Tobacco Use  . Smoking status: Never Smoker  . Smokeless tobacco: Current User    Types: Chew  Vaping Use  . Vaping Use: Never used  Substance Use Topics  . Alcohol use: Yes    Alcohol/week: 1.0 - 4.0 standard drink    Types: 1 - 4 Cans of beer per week  . Drug use: No       Medications: Outpatient Medications Prior to Visit  Medication Sig  . albuterol (VENTOLIN HFA) 108 (90 Base) MCG/ACT inhaler Inhale 2 puffs into the lungs every 6 (six) hours as needed for wheezing or shortness of breath.  . ALPRAZolam (XANAX) 1 MG tablet Take 1  tablet (1 mg total) by mouth daily as needed.  . Cyanocobalamin (B-12) 500 MCG SUBL Place 1 tablet under the tongue daily. Recheck lab in 3 months.  [name] ketoconazole (NIZORAL) 2 % cream Apply 1 application topically 2 (two) times daily.  [name] levocetirizine (XYZAL) 5 MG tablet Take 1 tablet (5 mg total) by mouth every evening.  [name] omeprazole (PRILOSEC) 20 MG capsule Take 1 capsule (20 mg total) by mouth daily.  . Vitamin D, Ergocalciferol, (DRISDOL) 1.25 MG (50000 UNIT) CAPS capsule Take 1 capsule (50,000 Units total) by mouth every 7 (seven) days. (taking one tablet per week) walk in lab in office 1-2 weeks after completing prescription.  . [DISCONTINUED] amoxicillin-clavulanate (AUGMENTIN) 875-125 MG tablet Take 1 tablet by mouth 2 (two) times daily. (Patient not taking: Reported on 08/27/2020)   No facility-administered medications prior to visit.    Review of Systems  Neurological: Positive for dizziness, seizures and light-headedness. Negative for numbness.       Numbness after seizure episode.       Objective    BP 125/79 (BP Location: Right Arm, Patient Position: Sitting, Cuff Size: Large)   Pulse 64   Temp 97.9 F (36.6 C) (Oral)   Ht 6' (1.829 m)   Wt 199 lb (90.3 kg)   BMI 26.99 kg/m     Physical Exam Vitals reviewed.  Constitutional:      General: He is not in acute distress.    Appearance: Normal appearance. He is not diaphoretic.  HENT:  Head: Normocephalic and atraumatic.  Eyes:     General: No scleral icterus.    Conjunctiva/sclera: Conjunctivae normal.  Cardiovascular:     Rate and Rhythm: Normal rate and regular rhythm.     Pulses: Normal pulses.     Heart sounds: Normal heart sounds. No murmur heard.   Pulmonary:     Effort: Pulmonary effort is normal. No respiratory distress.     Breath sounds: Normal breath sounds. No wheezing or rhonchi.  Abdominal:     General: There is no distension.     Palpations: Abdomen is soft.     Tenderness: There is no  abdominal tenderness.  Musculoskeletal:     Cervical back: Neck supple.     Right lower leg: No edema.     Left lower leg: No edema.  Lymphadenopathy:     Cervical: No cervical adenopathy.  Skin:    General: Skin is warm and dry.     Capillary Refill: Capillary refill takes less than 2 seconds.     Findings: No rash.  Neurological:     General: No focal deficit present.     Mental Status: He is alert and oriented to person, place, and time.     Cranial Nerves: No cranial nerve deficit.     Sensory: No sensory deficit.     Motor: No weakness.     Coordination: Coordination normal.     Gait: Gait normal.     Comments: FNF, RAM intact. Negative Romberg  Psychiatric:        Mood and Affect: Mood normal.        Behavior: Behavior normal.       No results found for any visits on 08/27/20.  Assessment & Plan     Problem List Items Addressed This Visit      Cardiovascular and Mediastinum   Flushing    Persists Now off of prednisone At follow-up appointment, can check cortisol level Consider endocrine referral        Nervous and Auditory   Dysfunction of both eustachian tubes    Patient had his ears examined by ENT this morning and was told to continue treatment for eustachian tube dysfunction with antihistamines and nasal spray He was also started on Singulair        Other   Witnessed seizure-like activity (HCC) - Primary    New problem with witnessed generalized tonic-clonic seizure-like activity on 2/22 He was treated for syncope in the emergency room, but his wife states that the story was very different from what is reported in the ER note She states he was not having a panic attack at the time that this happened he seemed to lose consciousness slumped out of the chair and had generalized muscle contractions and seizure-like activity for about 3 minutes He was also in a postictal state for roughly 30 minutes after this occurred Concern for possible seizure It seems  he has also been having some episodes where it is difficult to get his attention and he seems to be staring off into space for several minutes Referral to neurology for further evaluation Discussed that he may need EEG or MRI brain Reviewed CT head with the patient and his wife Discussed seizure precautions and to avoid swimming alone, climbing a ladder, driving until he can be evaluated      Relevant Orders   Ambulatory referral to Neurology       Return for as scheduled.      I, Shirlee Latch,  MD, have reviewed all documentation for this visit. The documentation on 08/28/20 for the exam, diagnosis, procedures, and orders are all accurate and complete.   Adra Shepler, Marzella Schlein, MD, MPH [facility]

## 2020-08-27 NOTE — Telephone Encounter (Signed)
Syncope of uncertain etiology at this time. Cannot exclude arrhythmogenic. Please send patient a Zio AT for repeat outpatient cardiac monitoring. Dx: Syncope. Please refer him to EP for consideration of a loop recorder given syncope.

## 2020-08-27 NOTE — Patient Instructions (Signed)
Seizure, Adult A seizure is a sudden burst of abnormal electrical and chemical activity in the brain. Seizures usually last from 30 seconds to 2 minutes. The abnormal activity temporarily interrupts normal brain function. Many types of seizures can affect adults. A seizure can cause many different symptoms depending on where in the brain it starts. What are the causes? Common causes of this condition include:  Fever or infection.  Brain injury, head trauma, bleeding in the brain, or a brain tumor.  Low levels of blood sugar or salt (sodium).  Kidney problems or liver problems.  Metabolic disorders or other conditions that are passed from parent to child (are inherited).  Reaction to a substance, such as a drug or a medicine, or suddenly stopping the use of a substance (withdrawal).  A stroke.  Developmental disorders such as autism spectrum disorder or cerebral palsy. In some cases, the cause of a seizure may not be known. Some people who have a seizure never have another one. A person who has repeated seizures over time without a clear cause has a condition called epilepsy. What increases the risk? You are more likely to develop this condition if:  You have a family history of epilepsy.  You have had a tonic-clonic seizure before. This type of seizure causes tightening (contraction) of the muscles of the whole body and loss of consciousness.  You have a history of head trauma, lack of oxygen at birth, or strokes. What are the signs or symptoms? There are many different types of seizures. The symptoms vary depending on the type of seizure you have. Symptoms occur during the seizure. They may also occur before a seizure (aura) and after a seizure (postictal). Symptoms may include the following: Symptoms during a seizure  Uncontrollable shaking (convulsions) with fast, jerky movements of muscles.  Stiffening of the body.  Breathing problems.  Confusion, staring, or  unresponsiveness.  Head nodding, eye blinking or fluttering, or rapid eye movements.  Drooling, grunting, or making clicking sounds with your mouth.  Loss of bladder control and bowel control. Symptoms before a seizure  Fear or anxiety.  Nausea.  Vertigo. This is a feeling like: ? You are moving when you are not. ? Your surroundings are moving when they are not.  Dj vu. This is a feeling of having seen or heard something before.  Odd tastes or smells.  Changes in vision, such as seeing flashing lights or spots. Symptoms after a seizure  Confusion.  Sleepiness.  Headache.  Sore muscles. How is this diagnosed? This condition may be diagnosed based on:  A description of your symptoms. Video of your seizures can be helpful.  Your medical history.  A physical exam. You may also have tests, including:  Blood tests.  CT scan.  MRI.  Electroencephalogram (EEG). This test measures electrical activity in the brain. An EEG can predict whether seizures will return.  A spinal tap, also called a lumbar puncture. This is the removal and testing of fluid that surrounds the brain and spinal cord. How is this treated? Most seizures will stop on their own in less than 5 minutes, and no treatment is needed. Seizures that last longer than 5 minutes will usually need treatment. Seizures may be treated with:  Medicines given through an IV.  Avoiding known triggers, such as medicines that you take for another condition.  Medicines to control seizures or prevent future seizures (antiepileptics), if epilepsy caused your seizures.  Medical devices to prevent and control seizures.  Surgery   to stop seizures or to reduce how often seizures happen, if you have epilepsy that does not respond to medicines.  A diet low in carbohydrates and high in fat (ketogenic diet). Follow these instructions at home: Medicines  Take over-the-counter and prescription medicines only as told by  your health care provider.  Avoid any substances that may prevent your medicine from working properly, such as alcohol. Activity  Follow instructions about activities, such as driving or swimming, that would be dangerous if you had another seizure. Wait until your health care provider says it is safe to do them.  If you live in the [place] check with your local department of motor vehicles [facility]) to find out about local driving laws. Each state has specific rules about when you can legally drive again.  Get enough rest. Lack of sleep can make seizures more likely to occur. Educating others  Teach friends and family what to do if you have a seizure. They should: ? Help you get down to the ground, to prevent a fall. ? Cushion your head and move items away from your body. ? Loosen any tight clothing around your neck. ? Turn you on your side. If you vomit, this helps keep your airway clear. ? Know whether or not you need emergency care. ? Stay with you until you recover.  Also, tell them what not to do if you have a seizure. Tell them: ? They should not hold you down. Holding you down will not stop the seizure. ? They should not put anything in your mouth.   General instructions  Avoid anything that has ever triggered a seizure for you.  Keep a seizure diary. Record what you remember about each seizure, especially anything that might have triggered it.  Keep all follow-up visits. This is important. Contact a health care provider if:  You have another seizure or seizures. Call each time you have a seizure.  Your seizure pattern changes.  You continue to have seizures with treatment.  You have symptoms of an infection or illness. Either of these might increase your risk of having a seizure.  You are unable to take your medicine. Get help right away if:  You have: ? A seizure that does not stop after 5 minutes. ? Several seizures in a row without a complete recovery between  seizures. ? A seizure that makes it harder to breathe. ? A seizure that leaves you unable to speak or use a part of your body.  You do not wake up right away after a seizure.  You injure yourself during a seizure.  You have confusion or pain right after a seizure. These symptoms may represent a serious problem that is an emergency. Do not wait to see if the symptoms will go away. Get medical help right away. Call your local emergency services (911 in the [place] Do not drive yourself to the hospital. Summary  Seizures are caused by abnormal electrical and chemical activity in the brain. The activity disrupts normal brain function and can cause various symptoms.  Seizures have many causes, including illness, head injuries, low levels of blood sugar or salt, and certain conditions.  Most seizures will stop on their own in less than 5 minutes. Seizures that last longer than 5 minutes are a medical emergency and need treatment right away.  Many medicines are used to treat seizures. Take over-the-counter and prescription medicines only as told by your health care provider. This information is not intended to replace advice  given to you by your health care provider. Make sure you discuss any questions you have with your health care provider. Document Revised: 12/27/2019 Document Reviewed: 12/27/2019 Elsevier Patient Education  2021 Elsevier Inc.  

## 2020-08-28 DIAGNOSIS — R569 Unspecified convulsions: Secondary | ICD-10-CM | POA: Insufficient documentation

## 2020-08-28 NOTE — Telephone Encounter (Signed)
Patient made aware of Eula Listen, PA response and recommendation.

## 2020-08-28 NOTE — Assessment & Plan Note (Signed)
New problem with witnessed generalized tonic-clonic seizure-like activity on 2/22 He was treated for syncope in the emergency room, but his wife states that the story was very different from what is reported in the ER note She states he was not having a panic attack at the time that this happened he seemed to lose consciousness slumped out of the chair and had generalized muscle contractions and seizure-like activity for about 3 minutes He was also in a postictal state for roughly 30 minutes after this occurred Concern for possible seizure It seems he has also been having some episodes where it is difficult to get his attention and he seems to be staring off into space for several minutes Referral to neurology for further evaluation Discussed that he may need EEG or MRI brain Reviewed CT head with the patient and his wife Discussed seizure precautions and to avoid swimming alone, climbing a ladder, driving until he can be evaluated

## 2020-08-28 NOTE — Assessment & Plan Note (Signed)
Patient had his ears examined by ENT this morning and was told to continue treatment for eustachian tube dysfunction with antihistamines and nasal spray He was also started on Singulair

## 2020-08-28 NOTE — Telephone Encounter (Signed)
We will wait and consider those if his AM cortisol level through his PCP's office is unrevealing.

## 2020-08-28 NOTE — Assessment & Plan Note (Signed)
Persists Now off of prednisone At follow-up appointment, can check cortisol level Consider endocrine referral

## 2020-09-01 ENCOUNTER — Other Ambulatory Visit: Payer: Self-pay

## 2020-09-01 ENCOUNTER — Ambulatory Visit
Admission: RE | Admit: 2020-09-01 | Discharge: 2020-09-01 | Disposition: A | Discharge status: Home / Self Care | Payer: 59 | Source: Ambulatory Visit | Attending: Family Medicine | Admitting: Family Medicine

## 2020-09-01 DIAGNOSIS — R1011 Right upper quadrant pain: Secondary | ICD-10-CM | POA: Diagnosis present

## 2020-09-02 ENCOUNTER — Other Ambulatory Visit: Payer: Self-pay | Admitting: Neurology

## 2020-09-02 DIAGNOSIS — R569 Unspecified convulsions: Secondary | ICD-10-CM

## 2020-09-09 ENCOUNTER — Encounter: Payer: Self-pay | Admitting: Family Medicine

## 2020-09-16 ENCOUNTER — Other Ambulatory Visit: Payer: Self-pay

## 2020-09-16 ENCOUNTER — Ambulatory Visit
Admission: RE | Admit: 2020-09-16 | Discharge: 2020-09-16 | Disposition: A | Discharge status: Home / Self Care | Payer: 59 | Source: Ambulatory Visit | Attending: Neurology | Admitting: Neurology

## 2020-09-16 DIAGNOSIS — R569 Unspecified convulsions: Secondary | ICD-10-CM | POA: Insufficient documentation

## 2020-09-16 MED ORDER — GADOBUTROL 1 MMOL/ML IV SOLN
9.0000 mL | Freq: Once | INTRAVENOUS | Status: AC | PRN
  Administered 2020-09-16: 9 mL via INTRAVENOUS

## 2020-09-17 ENCOUNTER — Encounter: Payer: Self-pay | Admitting: Family Medicine

## 2020-09-17 ENCOUNTER — Other Ambulatory Visit: Payer: Self-pay

## 2020-09-17 ENCOUNTER — Ambulatory Visit: Payer: 59 | Admitting: Family Medicine

## 2020-09-17 VITALS — BP 126/80 | HR 93 | Temp 98.7°F | Resp 16 | Ht 70.0 in | Wt 204.5 lb

## 2020-09-17 DIAGNOSIS — R232 Flushing: Secondary | ICD-10-CM | POA: Diagnosis not present

## 2020-09-17 NOTE — Progress Notes (Signed)
Established patient visit   Patient: [name] [name]   DOB: [birth year]   34 y.o. Male  MRN: [id number] Visit Date: 09/17/2020  Today's healthcare provider: Shirlee Latch, MD   Chief Complaint  Patient presents with   Follow-up   Subjective    HPI  Follow up for seizure-like activity  The patient was last seen for this 3 weeks ago. Changes made at last visit include referral to neurology.  He reports excellent compliance with treatment. He feels that condition is Improved. He is not having side effects.    Continues to have frequent flushing of chest and face.  He has not noticed any triggers and he has been trying to keep a symptom diary.  He does not have any rash when he is not flushed.  He has occasional diarrhea, but it is not associated with the flushing.  He is seeing cardiology for palpitations/tachycardia.  He finds his blood pressure is elevated when he is in doctors offices occasionally. -----------------------------------------------------------------------------------------  Patient Active Problem List   Diagnosis Date Noted   Witnessed seizure-like activity (HCC) 08/28/2020   Flushing 08/18/2020   Urticaria 08/18/2020   Vitamin D deficiency 08/15/2020   Paresthesia of both hands 08/13/2020   Dysfunction of both eustachian tubes 07/17/2020   Non-restorative sleep 12/20/2017   Migraine with aura 12/20/2017   Fatigue 09/19/2017   Panic attacks 08/18/2017   GERD (gastroesophageal reflux disease) 06/01/2017   Anxiety 06/01/2017   Non-seasonal allergic rhinitis 06/01/2017   Social History   Tobacco Use   Smoking status: Never Smoker   Smokeless tobacco: Current User    Types: Chew  Vaping Use   Vaping Use: Never used  Substance Use Topics   Alcohol use: Yes    Alcohol/week: 1.0 - 4.0 standard drink    Types: 1 - 4 Cans of beer per week   Drug use: No   Allergies  Allergen Reactions   Erythromycin Rash        Medications: Outpatient Medications Prior to Visit  Medication Sig   albuterol (VENTOLIN HFA) 108 (90 Base) MCG/ACT inhaler Inhale 2 puffs into the lungs every 6 (six) hours as needed for wheezing or shortness of breath.   ALPRAZolam (XANAX) 1 MG tablet Take 1 tablet (1 mg total) by mouth daily as needed.   Cyanocobalamin (B-12) 500 MCG SUBL Place 1 tablet under the tongue daily. Recheck lab in 3 months.   ketoconazole (NIZORAL) 2 % cream Apply 1 application topically 2 (two) times daily.   omeprazole (PRILOSEC) 20 MG capsule Take 1 capsule (20 mg total) by mouth daily.   Vitamin D, Ergocalciferol, (DRISDOL) 1.25 MG (50000 UNIT) CAPS capsule Take 1 capsule (50,000 Units total) by mouth every 7 (seven) days. (taking one tablet per week) walk in lab in office 1-2 weeks after completing prescription.   [DISCONTINUED] levocetirizine (XYZAL) 5 MG tablet Take 1 tablet (5 mg total) by mouth every evening.   No facility-administered medications prior to visit.    Review of Systems  Constitutional: Negative.   Eyes: Negative.   Respiratory: Negative.   Cardiovascular: Negative.   Endocrine: Negative.   Neurological: Negative.     Last CBC Lab Results  Component Value Date   WBC 9.2 08/25/2020   HGB 14.7 08/25/2020   HCT 42.3 08/25/2020   MCV 87.0 08/25/2020   MCH 30.2 08/25/2020   RDW 12.2 08/25/2020   PLT 162 08/25/2020   Last metabolic panel Lab Results  Component Value Date  GLUCOSE 103 (H) 08/25/2020   NA 137 08/25/2020   K 3.7 08/25/2020   CL 104 08/25/2020   CO2 25 08/25/2020   BUN 15 08/25/2020   CREATININE 1.25 (H) 08/25/2020   GFRNONAA >60 08/25/2020   GFRAA 109 08/14/2020   CALCIUM 9.5 08/25/2020   PROT 7.5 08/25/2020   ALBUMIN 4.5 08/25/2020   LABGLOB 2.5 08/14/2020   AGRATIO 1.9 08/14/2020   BILITOT 0.9 08/25/2020   ALKPHOS 71 08/25/2020   AST 25 08/25/2020   ALT 48 (H) 08/25/2020   ANIONGAP 8 08/25/2020   Last lipids Lab Results  Component  Value Date   CHOL 232 (H) 02/14/2020   HDL 38 (L) 02/14/2020   LDLCALC 148 (H) 02/14/2020   TRIG 250 (H) 02/14/2020   CHOLHDL 6.1 (H) 02/14/2020   Last thyroid functions Lab Results  Component Value Date   TSH 1.590 08/14/2020        Objective    BP 126/80 (BP Location: Left Arm, Patient Position: Sitting, Cuff Size: Large)    Pulse 93    Temp 98.7 F (37.1 C) (Oral)    Resp 16    Ht 5\' 10"  (1.778 m)    Wt 204 lb 8 oz (92.8 kg)    SpO2 97%    BMI 29.34 kg/m  BP Readings from Last 3 Encounters:  09/17/20 126/80  08/27/20 125/79  08/26/20 (!) 142/97   Wt Readings from Last 3 Encounters:  09/17/20 204 lb 8 oz (92.8 kg)  08/27/20 199 lb (90.3 kg)  08/25/20 205 lb (93 kg)      Physical Exam Vitals reviewed.  Constitutional:      General: He is not in acute distress.    Appearance: Normal appearance. He is not diaphoretic.  HENT:     Head: Normocephalic and atraumatic.  Eyes:     General: No scleral icterus.    Conjunctiva/sclera: Conjunctivae normal.  Cardiovascular:     Rate and Rhythm: Normal rate and regular rhythm.     Pulses: Normal pulses.     Heart sounds: Normal heart sounds. No murmur heard.   Pulmonary:     Effort: Pulmonary effort is normal. No respiratory distress.     Breath sounds: Normal breath sounds. No wheezing or rhonchi.  Abdominal:     General: There is no distension.     Palpations: Abdomen is soft.     Tenderness: There is no abdominal tenderness.  Musculoskeletal:     Cervical back: Neck supple.     Right lower leg: No edema.     Left lower leg: No edema.  Lymphadenopathy:     Cervical: No cervical adenopathy.  Skin:    General: Skin is warm and dry.     Capillary Refill: Capillary refill takes less than 2 seconds.     Findings: Erythema present. No rash.  Neurological:     Mental Status: He is alert and oriented to person, place, and time.     Cranial Nerves: No cranial nerve deficit.  Psychiatric:        Mood and Affect: Mood  normal.        Behavior: Behavior normal.       No results found for any visits on 09/17/20.  Assessment & Plan     Problem List Items Addressed This Visit      Cardiovascular and Mediastinum   Flushing - Primary    Persists despite being off of prednisone Check cortisol level Endocrine referral If cortisol levels  normal, will consider 24-hour urine to rule out carcinoid syndrome and pheochromocytoma, though these are much less likely culprits He does not seem to have any rosacea      Relevant Orders   Cortisol   Ambulatory referral to Endocrinology       Return if symptoms worsen or fail to improve.      Total time spent on today's visit was greater than 30 minutes, including both face-to-face time and nonface-to-face time personally spent on review of chart (labs and imaging), discussing labs and goals, discussing further work-up, treatment options, referrals to specialist if needed, reviewing outside records of pertinent, answering patient's questions, and coordinating care.    I, Shirlee Latch, MD, have reviewed all documentation for this visit. The documentation on 09/18/20 for the exam, diagnosis, procedures, and orders are all accurate and complete.   [name] [name], [name] Schlein, MD, MPH [facility]

## 2020-09-18 NOTE — Progress Notes (Deleted)
Cardiology Office Note    Date:  09/18/2020   ID:  [name] [name], DOB 1986-04-19, MRN [id number]  PCP:  [name], MD  Cardiologist:  [name], MD  Electrophysiologist:  None   Chief Complaint: Follow up  History of Present Illness:   [name] [name] is a 35 y.o. male with history of bicuspid aortic valve with mild regurgitation by echo in 03/2020, seizure-like activity, HLD, anxiety, and chest pain who presents for follow-up of ***.  He was seen by Dr. [name] as a new patient for ED follow-up of chest pain and shortness of in 03/2020. At that time he noted intermittent episodes of left-sided chest tightness that radiated to his back and between his shoulder blades occurring both with activity and at rest. At his ED visit in early 03/2020 high-sensitivity troponin was negative x2, EKG showed no ischemic changes, and chest x-ray was unrevealing. Subsequent echo on 04/02/2020 showed an EF of 55 to 60%, no regional wall motion abnormalities, no LVH, normal LV diastolic function parameters, normal RV systolic function and ventricular cavity size, bicuspid aortic valve with mild regurgitation and sclerosis without evidence of stenosis, estimated right atrial pressure 3 mmHg, and a normal size and structure aortic root measuring 3.2 cm, ascending aorta 3.1 cm, and aortic arch 2.5 cm.He was seen in follow-up on 04/16/2020 and was doing well from a cardiac perspective. He did note that his grandparents had history of valve replacements though he was unsure of the specifics. He reported one episode of chest discomfort that self resolved after several minutes. He continued to exercise daily at a high intensity without limitations. It was noted the previously recommended ETT was postponed due to travel. He contacted our office in early 06/2020 noting a several day history of palpitations occurring after meals. Zio patch was recommended with subsequent follow up.He preferred to check  with his insurance company first and advisedus hewould [employer] he wanted to proceed with outpatient cardiac monitoring.  He was seen in the office in 07/2020 noting an approximate 79-month history of tachypalpitations that had been progressively worsening over the preceding several weeks.  At times there was some associated chest pain, dizziness, diaphoresis, and vision disturbance.  No frank syncope.  He had been prescribed as needed Xanax by his PCP though did not notice any improvement in his symptoms.  Zio patch showed a predominant rhythm of sinus with an average heart rate of 86 bpm with a range of 46 to 163 bpm.  Rare PACs, atrial couplets, atrial triplets, and PVCs.  No significant arrhythmias were noted.    Following this he was  evaluated by his PCP for ongoing cough related to possible Covid illness in 06/2020 with chest x-ray being unrevealing.  He was seen by his PCP on 08/18/2020 for flushing and urticaria with recommendation to discontinue prednisone (was taking this for acute otitis media and eustachian tube dysfunction) for flushing and urticaria felt to likely be post viral.  He was last seen in our office on 08/19/2020 and continued to note intermittent flushing and elevated heart rates in the 100s bpm at rest.  He had not had any further chest discomfort.  He continued to note intermittent headaches and frequent diarrhea which had been present for a couple of years.  He was seen in the ED on 08/25/2020 with seizure-like activity.  Work-up was unrevealing including CT head, and EKG. following this he was evaluated by neurology on 3/1 and was started on Lamictal.  Subsequent MRI of the brain was without acute abnormality.  He has since followed up with his PCP with continued flushing and right upper quadrant pain with ultrasound showing no acute pathology with a fatty liver noted.  Cortisol was normal as outlined below.  He has been referred to endocrinology.  ***   Labs  independently reviewed: 09/2020 - cortisol normal 08/2020 - Hgb 14.7, PLT 162, potassium 3.7, BUN 15, serum creatinine 1.25, albumin 4.5, AST normal, ALT 48, TSH normal 02/2020 - TC 232, TG 250, HDL 38, LDL 140  Past Medical History:  Diagnosis Date  . Acid reflux   . Allergy   . Anxiety   . Bicuspid aortic valve 04/2020  . Heart murmur     Past Surgical History:  Procedure Laterality Date  . VASECTOMY      Current Medications: No outpatient medications have been marked as taking for the 09/23/20 encounter (Appointment) with Sondra Barges, PA-C.    Allergies:   Erythromycin   Social History   Socioeconomic History  . Marital status: Married    Spouse name: Not on file  . Number of children: 3  . Years of education: Not on file  . Highest education level: Not on file  Occupational History    Comment: city of   Tobacco Use  . Smoking status: Never Smoker  . Smokeless tobacco: Current User    Types: Chew  Vaping Use  . Vaping Use: Never used  Substance and Sexual Activity  . Alcohol use: Yes    Alcohol/week: 1.0 - 4.0 standard drink    Types: 1 - 4 Cans of beer per week  . Drug use: No  . Sexual activity: Yes    Partners: Female    Birth control/protection: Surgical  Other Topics Concern  . Not on file  Social History Narrative  . Not on file   Social Determinants of Health   Financial Resource Strain: Not on file  Food Insecurity: Not on file  Transportation Needs: Not on file  Physical Activity: Not on file  Stress: Not on file  Social Connections: Not on file     Family History:  The patient's family history includes Anxiety disorder in his mother; Cancer in his paternal grandmother; Diabetes in his father and paternal grandfather; Heart Problems in his maternal grandmother; Heart murmur in his mother; Hypothyroidism in his sister; Thyroid disease in his mother. There is no history of Prostate cancer, Kidney cancer, Bladder Cancer, Breast cancer,  or Colon cancer.  ROS:   ROS   EKGs/Labs/Other Studies Reviewed:    Studies reviewed were summarized above. The additional studies were reviewed today:  Zio patch 07/2020: Patient had a min HR of 46 bpm, max HR of 163 bpm, and avg HR of 86 bpm. Predominant underlying rhythm was Sinus Rhythm. Isolated SVEs were rare (<1.0%), SVE Couplets were rare (<1.0%), and SVE Triplets were rare (<1.0%). Isolated VEs were rare (<1.0%),  and no VE Couplets or VE Triplets were present.  __________  2D echo 04/02/2020: 1. Left ventricular ejection fraction, by estimation, is 55 to 60%. The  left ventricle has normal function. The left ventricle has no regional  wall motion abnormalities. Left ventricular diastolic parameters were  normal.  2. Right ventricular systolic function is normal. The right ventricular  size is normal.  3. The mitral valve is normal in structure. No evidence of mitral valve  regurgitation. No evidence of mitral stenosis.  4. Bicuspid aortic valve type 1 (raphe/calcification  between R-L cusp).  The aortic valve is bicuspid. Aortic valve regurgitation is mild. Mild  aortic valve sclerosis is present, with no evidence of aortic valve  stenosis.  5. The inferior vena cava is normal in size with greater than 50%  respiratory variability, suggesting right atrial pressure of 3 mmHg.   EKG:  EKG is ordered today.  The EKG ordered today demonstrates ***  Recent Labs: 08/14/2020: TSH 1.590 08/25/2020: ALT 48; BUN 15; Creatinine, Ser 1.25; Hemoglobin 14.7; Platelets 162; Potassium 3.7; Sodium 137  Recent Lipid Panel    Component Value Date/Time   CHOL 232 (H) 02/14/2020 0804   TRIG 250 (H) 02/14/2020 0804   HDL 38 (L) 02/14/2020 0804   CHOLHDL 6.1 (H) 02/14/2020 0804   LDLCALC 148 (H) 02/14/2020 0804    PHYSICAL EXAM:    VS:  There were no vitals taken for this visit.  BMI: There is no height or weight on file to calculate BMI.  Physical Exam  Wt Readings from Last  3 Encounters:  09/17/20 204 lb 8 oz (92.8 kg)  08/27/20 199 lb (90.3 kg)  08/25/20 205 lb (93 kg)     ASSESSMENT & PLAN:   1. ***  Disposition: F/u with Dr. [name] or an APP in ***.   Medication Adjustments/Labs and Tests Ordered: Current medicines are reviewed at length with the patient today.  Concerns regarding medicines are outlined above. Medication changes, Labs and Tests ordered today are summarized above and listed in the Patient Instructions accessible in Encounters.   Signed, Eula Listen, PA-C 09/18/2020 1:17 PM     [facility] [place] [address] [place], [place] [address] [phone]

## 2020-09-18 NOTE — Assessment & Plan Note (Signed)
Persists despite being off of prednisone Check cortisol level Endocrine referral If cortisol levels normal, will consider 24-hour urine to rule out carcinoid syndrome and pheochromocytoma, though these are much less likely culprits He does not seem to have any rosacea

## 2020-09-19 LAB — CORTISOL: Cortisol: 18.3 ug/dL

## 2020-09-21 ENCOUNTER — Other Ambulatory Visit: Payer: Self-pay | Admitting: Family Medicine

## 2020-09-21 ENCOUNTER — Telehealth: Payer: Self-pay

## 2020-09-21 DIAGNOSIS — R232 Flushing: Secondary | ICD-10-CM

## 2020-09-21 NOTE — Telephone Encounter (Signed)
Called patient and no answer, LVMTRC. If patient calls back okay for [facility] to advise. 

## 2020-09-21 NOTE — Telephone Encounter (Signed)
-----   Message from [name], MD sent at 09/21/2020 12:13 PM EDT ----- Normal cortisol level.  I would like to proceed with 24 urine collection to rule out pheochromocytoma and carcinoid syndrome if he agrees. Will need to come by and get instructions for collection and supplies. Labs were ordered already.

## 2020-09-23 ENCOUNTER — Ambulatory Visit: Payer: 59 | Admitting: Physician Assistant

## 2020-09-28 ENCOUNTER — Telehealth: Payer: Self-pay

## 2020-09-28 NOTE — Telephone Encounter (Signed)
Pt advised that his FMLA forms have been completed. Pt stated that he would like to pick up the forms and turn them in himself rather than having [place] fax the forms. Pt advised copy would be placed up front and offices hours were provided. TNP

## 2020-10-02 NOTE — Progress Notes (Deleted)
Cardiology Office Note    Date:  10/02/2020   ID:  [name] [name], DOB Nov 11, 1985, MRN [id number]  PCP:  [name], MD  Cardiologist:  [name], MD  Electrophysiologist:  None   Chief Complaint: Follow up  History of Present Illness:   [name] [name] is a 35 y.o. male with history of bicuspid aortic valve with mild regurgitation by echo in 03/2020, seizure-like activity, HLD, anxiety, and chest pain who presents for follow-up of ***.  He was seen by Dr. [name] as a new patient for ED follow-up of chest pain and shortness of in 03/2020. At that time he noted intermittent episodes of left-sided chest tightness that radiated to his back and between his shoulder blades occurring both with activity and at rest. At his ED visit in early 03/2020 high-sensitivity troponin was negative x2, EKG showed no ischemic changes, and chest x-ray was unrevealing. Subsequent echo on 04/02/2020 showed an EF of 55 to 60%, no regional wall motion abnormalities, no LVH, normal LV diastolic function parameters, normal RV systolic function and ventricular cavity size, bicuspid aortic valve with mild regurgitation and sclerosis without evidence of stenosis, estimated right atrial pressure 3 mmHg, and a normal size and structure aortic root measuring 3.2 cm, ascending aorta 3.1 cm, and aortic arch 2.5 cm.He was seen in follow-up on 04/16/2020 and was doing well from a cardiac perspective. He did note that his grandparents had history of valve replacements though he was unsure of the specifics. He reported one episode of chest discomfort that self resolved after several minutes. He continued to exercise daily at a high intensity without limitations. It was noted the previously recommended ETT was postponed due to travel. He contacted our office in early 06/2020 noting a several day history of palpitations occurring after meals. Zio patch was recommended with subsequent follow up.He preferred to check  with his insurance company first and advisedus hewould [employer] he wanted to proceed with outpatient cardiac monitoring.  He was seen in the office in 07/2020 noting an approximate 74-month history of tachypalpitations that had been progressively worsening over the preceding several weeks.  At times there was some associated chest pain, dizziness, diaphoresis, and vision disturbance.  No frank syncope.  He had been prescribed as needed Xanax by his PCP though did not notice any improvement in his symptoms.  Zio patch showed a predominant rhythm of sinus with an average heart rate of 86 bpm with a range of 46 to 163 bpm.  Rare PACs, atrial couplets, atrial triplets, and PVCs.  No significant arrhythmias were noted.    Following this he was  evaluated by his PCP for ongoing cough related to possible Covid illness in 06/2020 with chest x-ray being unrevealing.  He was seen by his PCP on 08/18/2020 for flushing and urticaria with recommendation to discontinue prednisone (was taking this for acute otitis media and eustachian tube dysfunction) for flushing and urticaria felt to likely be post viral.  He was last seen in our office on 08/19/2020 and continued to note intermittent flushing and elevated heart rates in the 100s bpm at rest.  He had not had any further chest discomfort.  He continued to note intermittent headaches and frequent diarrhea which had been present for a couple of years.  He was seen in the ED on 08/25/2020 with seizure-like activity.  Work-up was unrevealing including CT head, and EKG. following this he was evaluated by neurology on 3/1 and was started on Lamictal.  Subsequent MRI of the brain was without acute abnormality.  He has since followed up with his PCP with continued flushing and right upper quadrant pain with ultrasound showing no acute pathology with a fatty liver noted.  Cortisol was normal as outlined below.  He has been referred to endocrinology.  ***   Labs  independently reviewed: 09/2020 - cortisol normal 08/2020 - Hgb 14.7, PLT 162, potassium 3.7, BUN 15, serum creatinine 1.25, albumin 4.5, AST normal, ALT 48, TSH normal 02/2020 - TC 232, TG 250, HDL 38, LDL 140   Past Medical History:  Diagnosis Date  . Acid reflux   . Allergy   . Anxiety   . Bicuspid aortic valve 04/2020  . Heart murmur     Past Surgical History:  Procedure Laterality Date  . VASECTOMY      Current Medications: No outpatient medications have been marked as taking for the 10/08/20 encounter (Appointment) with Sondra Barges, PA-C.    Allergies:   Erythromycin   Social History   Socioeconomic History  . Marital status: Married    Spouse name: Not on file  . Number of children: 3  . Years of education: Not on file  . Highest education level: Not on file  Occupational History    Comment: city of [place]  Tobacco Use  . Smoking status: Never Smoker  . Smokeless tobacco: Current User    Types: Chew  Vaping Use  . Vaping Use: Never used  Substance and Sexual Activity  . Alcohol use: Yes    Alcohol/week: 1.0 - 4.0 standard drink    Types: 1 - 4 Cans of beer per week  . Drug use: No  . Sexual activity: Yes    Partners: Female    Birth control/protection: Surgical  Other Topics Concern  . Not on file  Social History Narrative  . Not on file   Social Determinants of Health   Financial Resource Strain: Not on file  Food Insecurity: Not on file  Transportation Needs: Not on file  Physical Activity: Not on file  Stress: Not on file  Social Connections: Not on file     Family History:  The patient's family history includes Anxiety disorder in his mother; Cancer in his paternal grandmother; Diabetes in his father and paternal grandfather; Heart Problems in his maternal grandmother; Heart murmur in his mother; Hypothyroidism in his sister; Thyroid disease in his mother. There is no history of Prostate cancer, Kidney cancer, Bladder Cancer, Breast cancer,  or Colon cancer.  ROS:   ROS   EKGs/Labs/Other Studies Reviewed:    Studies reviewed were summarized above. The additional studies were reviewed today:  Zio patch 07/2020: Patient had a min HR of 46 bpm, max HR of 163 bpm, and avg HR of 86 bpm. Predominant underlying rhythm was Sinus Rhythm. Isolated SVEs were rare (<1.0%), SVE Couplets were rare (<1.0%), and SVE Triplets were rare (<1.0%). Isolated VEs were rare (<1.0%),  and no VE Couplets or VE Triplets were present.  __________  2D echo 04/02/2020: 1. Left ventricular ejection fraction, by estimation, is 55 to 60%. The  left ventricle has normal function. The left ventricle has no regional  wall motion abnormalities. Left ventricular diastolic parameters were  normal.  2. Right ventricular systolic function is normal. The right ventricular  size is normal.  3. The mitral valve is normal in structure. No evidence of mitral valve  regurgitation. No evidence of mitral stenosis.  4. Bicuspid aortic valve type 1 (  raphe/calcification between R-L cusp).  The aortic valve is bicuspid. Aortic valve regurgitation is mild. Mild  aortic valve sclerosis is present, with no evidence of aortic valve  stenosis.  5. The inferior vena cava is normal in size with greater than 50%  respiratory variability, suggesting right atrial pressure of 3 mmHg.   EKG:  EKG is ordered today.  The EKG ordered today demonstrates ***  Recent Labs: 08/14/2020: TSH 1.590 08/25/2020: ALT 48; BUN 15; Creatinine, Ser 1.25; Hemoglobin 14.7; Platelets 162; Potassium 3.7; Sodium 137  Recent Lipid Panel    Component Value Date/Time   CHOL 232 (H) 02/14/2020 0804   TRIG 250 (H) 02/14/2020 0804   HDL 38 (L) 02/14/2020 0804   CHOLHDL 6.1 (H) 02/14/2020 0804   LDLCALC 148 (H) 02/14/2020 0804    PHYSICAL EXAM:    VS:  There were no vitals taken for this visit.  BMI: There is no height or weight on file to calculate BMI.  Physical Exam  Wt Readings from Last  3 Encounters:  09/17/20 204 lb 8 oz (92.8 kg)  08/27/20 199 lb (90.3 kg)  08/25/20 205 lb (93 kg)     ASSESSMENT & PLAN:   1. ***  Disposition: F/u with Dr. [name] or an APP in ***.   Medication Adjustments/Labs and Tests Ordered: Current medicines are reviewed at length with the patient today.  Concerns regarding medicines are outlined above. Medication changes, Labs and Tests ordered today are summarized above and listed in the Patient Instructions accessible in Encounters.   Signed, Eula Listen, PA-C 10/02/2020 3:03 PM     [facility] [place] [address] [place], [place] [address] [phone]

## 2020-10-08 ENCOUNTER — Ambulatory Visit: Payer: 59 | Admitting: Physician Assistant

## 2020-10-12 NOTE — Telephone Encounter (Signed)
Pt called and stated that he didn't feel comfortable with his employer knowing his medical history and diagnoses and asked if on his FMLA paper work that can be worded differently/ if it can state there is a medical issue but not say exactly what the issue are/ please advise

## 2020-10-13 NOTE — Telephone Encounter (Signed)
I routed to Dr Sullivan Lone to review and advise. Thank you.

## 2020-10-13 NOTE — Telephone Encounter (Signed)
Please review over FMLA. KW

## 2020-10-13 NOTE — Telephone Encounter (Signed)
These forms were filled out by [name], MD since she is out on leave patient would need to see a provider and bring copy of forms to office. See media.

## 2020-10-13 NOTE — Telephone Encounter (Signed)
Patient was advised he states that he should not have to come in since he was recently seen, patient states that he would like to drop off forms for another physician to fill out and request that we leave out his medical condition. KW

## 2020-11-12 ENCOUNTER — Telehealth (INDEPENDENT_AMBULATORY_CARE_PROVIDER_SITE_OTHER): Payer: 59 | Admitting: Family Medicine

## 2020-11-12 ENCOUNTER — Encounter: Payer: Self-pay | Admitting: Family Medicine

## 2020-11-12 ENCOUNTER — Telehealth: Payer: Self-pay

## 2020-11-12 ENCOUNTER — Ambulatory Visit: Payer: Self-pay | Admitting: Family Medicine

## 2020-11-12 DIAGNOSIS — K760 Fatty (change of) liver, not elsewhere classified: Secondary | ICD-10-CM | POA: Diagnosis not present

## 2020-11-12 DIAGNOSIS — R569 Unspecified convulsions: Secondary | ICD-10-CM | POA: Diagnosis not present

## 2020-11-12 DIAGNOSIS — R232 Flushing: Secondary | ICD-10-CM

## 2020-11-12 DIAGNOSIS — F419 Anxiety disorder, unspecified: Secondary | ICD-10-CM | POA: Diagnosis not present

## 2020-11-12 NOTE — Assessment & Plan Note (Signed)
Chronic and intermittent Fairly well controlled Will continue Xanax very sparingly.

## 2020-11-12 NOTE — Assessment & Plan Note (Signed)
Now f/b Endocrinology Seems as though they have r/o Carcinoid, pheo Will continue working with them

## 2020-11-12 NOTE — Assessment & Plan Note (Addendum)
F/b neuro Not taking AED No further episodes

## 2020-11-12 NOTE — Telephone Encounter (Signed)
Copied from CRM [id number]. Topic: General - Other >> Nov 12, 2020  3:54 PM Gaetana Michaelis A wrote: Reason for CRM: Patient has returned missed call from practice staff member "Nicholos Johns"  Please contact to further advise when available

## 2020-11-12 NOTE — Assessment & Plan Note (Signed)
Discussed lifestyle changes Monitor LFTs annually

## 2020-11-12 NOTE — Patient Instructions (Signed)
Fatty Liver Disease  The liver converts food into energy, removes toxic material from the blood, makes important proteins, and absorbs necessary vitamins from food. Fatty liver disease occurs when too much fat has built up in your liver cells. Fatty liver disease is also called hepatic steatosis. In many cases, fatty liver disease does not cause symptoms or problems. It is often diagnosed when tests are being done for other reasons. However, over time, fatty liver can cause inflammation that may lead to more serious liver problems, such as scarring of the liver (cirrhosis) and liver failure. Fatty liver is associated with insulin resistance, increased body fat, high blood pressure (hypertension), and high cholesterol. These are features of metabolic syndrome and increase your risk for stroke, diabetes, and heart disease. What are the causes? This condition may be caused by components of metabolic syndrome:  Obesity.  Insulin resistance.  High cholesterol. Other causes:  Alcohol abuse.  Poor nutrition.  Cushing syndrome.  Pregnancy.  Certain drugs.  Poisons.  Some viral infections. What increases the risk? You are more likely to develop this condition if you:  Abuse alcohol.  Are overweight.  Have diabetes.  Have hepatitis.  Have a high triglyceride level.  Are pregnant. What are the signs or symptoms? Fatty liver disease often does not cause symptoms. If symptoms do develop, they can include:  Fatigue and weakness.  Weight loss.  Confusion.  Nausea, vomiting, or abdominal pain.  Yellowing of your skin and the white parts of your eyes (jaundice).  Itchy skin. How is this diagnosed? This condition may be diagnosed by:  A physical exam and your medical history.  Blood tests.  Imaging tests, such as an ultrasound, CT scan, or MRI.  A liver biopsy. A small sample of liver tissue is removed using a needle. The sample is then looked at under a  microscope. How is this treated? Fatty liver disease is often caused by other health conditions. Treatment for fatty liver may involve medicines and lifestyle changes to manage conditions such as:  Alcoholism.  High cholesterol.  Diabetes.  Being overweight or obese. Follow these instructions at home:  Do not drink alcohol. If you have trouble quitting, ask your health care provider how to safely quit with the help of medicine or a supervised program. This is important to keep your condition from getting worse.  Eat a healthy diet as told by your health care provider. Ask your health care provider about working with a dietitian to develop an eating plan.  Exercise regularly. This can help you lose weight and control your cholesterol and diabetes. Talk to your health care provider about an exercise plan and which activities are best for you.  Take over-the-counter and prescription medicines only as told by your health care provider.  Keep all follow-up visits. This is important.   Contact a health care provider if:  You have trouble controlling your: ? Blood sugar. This is especially important if you have diabetes. ? Cholesterol. ? Drinking of alcohol. Get help right away if:  You have abdominal pain.  You have jaundice.  You have nausea and are vomiting.  You vomit blood or material that looks like coffee grounds.  You have stools that are black, tar-like, or bloody. Summary  Fatty liver disease develops when too much fat builds up in the cells of your liver.  Fatty liver disease often causes no symptoms or problems. However, over time, fatty liver can cause inflammation that may lead to more serious liver   problems, such as scarring of the liver (cirrhosis).  You are more likely to develop this condition if you abuse alcohol, are pregnant, are overweight, have diabetes, have hepatitis, or have high triglyceride or cholesterol levels.  Contact your health care provider  if you have trouble controlling your blood sugar, cholesterol, or drinking of alcohol. This information is not intended to replace advice given to you by your health care provider. Make sure you discuss any questions you have with your health care provider. Document Revised: 04/02/2020 Document Reviewed: 04/02/2020 Elsevier Patient Education  2021 [employer]  

## 2020-11-12 NOTE — Progress Notes (Signed)
MyChart Video Visit    Virtual Visit via Video Note   This visit type was conducted due to national recommendations for restrictions regarding the COVID-19 Pandemic (e.g. social distancing) in an effort to limit this patient's exposure and mitigate transmission in our community. This patient is at least at moderate risk for complications without adequate follow up. This format is felt to be most appropriate for this patient at this time. Physical exam was limited by quality of the video and audio technology used for the visit.    Patient location: car Provider location: [facility] Persons involved in the visit: patient, provider  I discussed the limitations of evaluation and management by telemedicine and the availability of in person appointments. The patient expressed understanding and agreed to proceed.  Patient: [name] [name]   DOB: Jan 01, 1986   34 y.o. Male  MRN: [id number] Visit Date: 11/12/2020  Today's healthcare provider: Shirlee Latch, MD   Chief Complaint  Patient presents with  . Anxiety   Subjective    HPI  Anxiety, Follow-up  He was last seen for anxiety 4 months ago. Changes made at last visit include none patient to continue Alprazolam PRN.   He reports excellent compliance with treatment. He reports good tolerance of treatment. He is not having side effects.   He feels his anxiety is moderate and Improved since last visit.  Symptoms: No chest pain No difficulty concentrating  No dizziness No fatigue  No feelings of losing control No insomnia  No irritable No palpitations  No panic attacks No racing thoughts  No shortness of breath No sweating  No tremors/shakes    GAD-7 Results GAD-7 Generalized Anxiety Disorder Screening Tool 07/17/2020 06/08/2018 09/18/2017  1. Feeling Nervous, Anxious, or on Edge 1 1 1   2. Not Being Able to Stop or Control Worrying 0 1 1  3. Worrying Too Much About Different Things 1 1 1   4. Trouble  Relaxing 1 1 0  5. Being So Restless it's Hard To Sit Still 0 1 0  6. Becoming Easily Annoyed or Irritable 0 1 1  7. Feeling Afraid As If Something Awful Might Happen 0 0 1  Total GAD-7 Score 3 6 5   Difficulty At Work, Home, or Getting  Along With Others? Not difficult at all Not difficult at all -    PHQ-9 Scores PHQ9 SCORE ONLY 09/17/2020 08/27/2020 07/17/2020  PHQ-9 Total Score 3 9 1     Saw Dr 09/19/2020 for endocrinology for flushing. Did 24hr urine for pheo and carcinoid. States that sample was mishandled and he had to repeat, but was told it was wnl. Did blood work as well and all was wnl.   ---------------------------------------------------------------------------------------------------   Social History   Tobacco Use  . Smoking status: Never Smoker  . Smokeless tobacco: Current User    Types: Chew  Vaping Use  . Vaping Use: Never used  Substance Use Topics  . Alcohol use: Yes    Alcohol/week: 1.0 - 4.0 standard drink    Types: 1 - 4 Cans of beer per week  . Drug use: No      Medications: Outpatient Medications Prior to Visit  Medication Sig  . albuterol (VENTOLIN HFA) 108 (90 Base) MCG/ACT inhaler Inhale 2 puffs into the lungs every 6 (six) hours as needed for wheezing or shortness of breath.  . ALPRAZolam (XANAX) 1 MG tablet Take 1 tablet (1 mg total) by mouth daily as needed.  . Cyanocobalamin (B-12) 500 MCG SUBL  Place 1 tablet under the tongue daily. Recheck lab in 3 months.  [name] ketoconazole (NIZORAL) 2 % cream Apply 1 application topically 2 (two) times daily.  [name] omeprazole (PRILOSEC) 20 MG capsule Take 1 capsule (20 mg total) by mouth daily.  . Vitamin D, Ergocalciferol, (DRISDOL) 1.25 MG (50000 UNIT) CAPS capsule Take 1 capsule (50,000 Units total) by mouth every 7 (seven) days. (taking one tablet per week) walk in lab in office 1-2 weeks after completing prescription.   No facility-administered medications prior to visit.    Review of Systems  - per  HPI     Objective    There were no vitals taken for this visit.   Physical Exam Constitutional:      General: He is not in acute distress.    Appearance: Normal appearance. He is not diaphoretic.  HENT:     Head: Normocephalic.  Eyes:     Conjunctiva/sclera: Conjunctivae normal.  Pulmonary:     Effort: Pulmonary effort is normal. No respiratory distress.  Neurological:     Mental Status: He is alert and oriented to person, place, and time. Mental status is at baseline.  Psychiatric:        Mood and Affect: Mood normal.        Behavior: Behavior normal.        Assessment & Plan     Problem List Items Addressed This Visit      Cardiovascular and Mediastinum   Flushing    Now f/b Endocrinology Seems as though they have r/o Carcinoid, pheo Will continue working with them        Digestive   Fatty liver    Discussed lifestyle changes Monitor LFTs annually        Other   Anxiety - Primary    Chronic and intermittent Fairly well controlled Will continue Xanax very sparingly.      Witnessed seizure-like activity (HCC)    F/b neuro Not taking AED No further episodes          Return in about 3 months (around 02/12/2021) for as scheduled.     I discussed the assessment and treatment plan with the patient. The patient was provided an opportunity to ask questions and all were answered. The patient agreed with the plan and demonstrated an understanding of the instructions.   The patient was advised to call back or seek an in-person evaluation if the symptoms worsen or if the condition fails to improve as anticipated.  I provided 30 minutes of non-face-to-face time during this encounter.  I, Shirlee Latch, MD, have reviewed all documentation for this visit. The documentation on 11/12/20 for the exam, diagnosis, procedures, and orders are all accurate and complete.   Leea Rambeau, Marzella Schlein, MD, MPH [facility]

## 2021-02-18 ENCOUNTER — Ambulatory Visit (INDEPENDENT_AMBULATORY_CARE_PROVIDER_SITE_OTHER): Payer: 59 | Admitting: Family Medicine

## 2021-02-18 ENCOUNTER — Encounter: Payer: Self-pay | Admitting: Family Medicine

## 2021-02-18 ENCOUNTER — Other Ambulatory Visit: Payer: Self-pay

## 2021-02-18 VITALS — BP 130/83 | HR 90 | Temp 97.8°F | Resp 16 | Ht 72.0 in | Wt 204.9 lb

## 2021-02-18 DIAGNOSIS — F410 Panic disorder [episodic paroxysmal anxiety] without agoraphobia: Secondary | ICD-10-CM | POA: Diagnosis not present

## 2021-02-18 DIAGNOSIS — F419 Anxiety disorder, unspecified: Secondary | ICD-10-CM

## 2021-02-18 DIAGNOSIS — J3089 Other allergic rhinitis: Secondary | ICD-10-CM

## 2021-02-18 DIAGNOSIS — R6884 Jaw pain: Secondary | ICD-10-CM

## 2021-02-18 DIAGNOSIS — K219 Gastro-esophageal reflux disease without esophagitis: Secondary | ICD-10-CM

## 2021-02-18 DIAGNOSIS — E663 Overweight: Secondary | ICD-10-CM

## 2021-02-18 DIAGNOSIS — Z0000 Encounter for general adult medical examination without abnormal findings: Secondary | ICD-10-CM | POA: Diagnosis not present

## 2021-02-18 DIAGNOSIS — R739 Hyperglycemia, unspecified: Secondary | ICD-10-CM

## 2021-02-18 MED ORDER — IPRATROPIUM BROMIDE 0.03 % NA SOLN: 2.0000 | Freq: Two times a day (BID) | NASAL | 12 refills | Status: DC

## 2021-02-18 MED ORDER — ALPRAZOLAM 1 MG PO TABS: 1.0000 mg | ORAL_TABLET | Freq: Every day | ORAL | 0 refills | Status: DC | PRN

## 2021-02-18 NOTE — Assessment & Plan Note (Signed)
-   Chronic, well-controlled with occasional breakthrough symptoms - Continue Omeprazole daily - Counseled pt. to use TUMS prn for reflux relief during acute exacerbations

## 2021-02-18 NOTE — Assessment & Plan Note (Signed)
-   Stable and well-controlled - Continue Xanax prn for anxiety attacks

## 2021-02-18 NOTE — Progress Notes (Signed)
Annual Wellness Visit     Patient: [name] [name], Male    DOB: 1986/01/26, 35 y.o.   MRN: [id number] Visit Date: 02/18/2021  Today's Provider: [name], MD   Chief Complaint  Patient presents with   Annual Exam   Subjective    [name] [name] is a 35 y.o. male who presents today for his Annual Wellness Visit. He reports consuming a general diet. The patient has a physically strenuous job, but has no regular exercise apart from work.  He generally feels well. He reports sleeping well. He does have additional problems to discuss today.   HPI  Allergic Rhinitis - Pt. reports increased seasonal allergies, states that they typically worsen this time of year - Uses Zyrtec, Flonase, & Albuterol prn for flares; pt. feels like these medications are moderately helpful  Anxiety - Uses Xanax ~1-2x per month - Denies recent exacerbations, but endorses increased stress levels  GERD - Takes daily Prilosec, which has helped to improve symptoms - Continues to endorse occasional breakthrough reflux, worse at night  Masseter Pain - Pt. reports clenching teeth several times throughout the day - Has been noticing bilateral masseter pain in recent weeks - Denies TMJ pain & trismus  Health Maintenance - Due for Tdap booster  Medications: Outpatient Medications Prior to Visit  Medication Sig   albuterol (VENTOLIN HFA) 108 (90 Base) MCG/ACT inhaler Inhale 2 puffs into the lungs every 6 (six) hours as needed for wheezing or shortness of breath.   cetirizine (ZYRTEC) 10 MG tablet Take 10 mg by mouth daily.   fluticasone (FLONASE) 50 MCG/ACT nasal spray Place into both nostrils daily.   ketoconazole (NIZORAL) 2 % cream Apply 1 application topically 2 (two) times daily.   omeprazole (PRILOSEC) 20 MG capsule Take 1 capsule (20 mg total) by mouth daily.   [DISCONTINUED] ALPRAZolam (XANAX) 1 MG tablet Take 1 tablet (1 mg total) by mouth daily as needed.   [DISCONTINUED] Cyanocobalamin  (B-12) 500 MCG SUBL Place 1 tablet under the tongue daily. Recheck lab in 3 months. (Patient not taking: Reported on 02/18/2021)   [DISCONTINUED] Vitamin D, Ergocalciferol, (DRISDOL) 1.25 MG (50000 UNIT) CAPS capsule Take 1 capsule (50,000 Units total) by mouth every 7 (seven) days. (taking one tablet per week) walk in lab in office 1-2 weeks after completing prescription. (Patient not taking: Reported on 02/18/2021)   No facility-administered medications prior to visit.    Allergies  Allergen Reactions   Erythromycin Rash    Patient Care Team: Virginia Crews, MD as PCP - General (Family Medicine) Wellington Hampshire, MD as PCP - Cardiology (Cardiology)  Review of Systems  Constitutional:  Negative for diaphoresis, fatigue, fever and unexpected weight change.  HENT:         Masseter pain  Eyes: Negative.   Respiratory:  Negative for chest tightness and shortness of breath.   Cardiovascular: Negative.  Negative for chest pain and leg swelling.  Gastrointestinal: Negative.  Negative for abdominal pain, nausea and vomiting.  Endocrine: Negative.   Genitourinary: Negative.   Musculoskeletal: Negative.   Skin: Negative.   Allergic/Immunologic: Positive for environmental allergies.  Neurological: Negative.       Objective    Vitals: BP 130/83 (BP Location: Left Arm, Patient Position: Sitting, Cuff Size: Large)   Pulse 90   Temp 97.8 F (36.6 C) (Oral)   Resp 16   Ht 6' (1.829 m)   Wt 204 lb 14.4 oz (92.9 kg)   BMI  27.79 kg/m    Physical Exam Constitutional:      General: He is not in acute distress.    Appearance: Normal appearance. He is normal weight.  HENT:     Head: Normocephalic and atraumatic.     Jaw: No trismus, tenderness or swelling.     Right Ear: External ear normal.     Left Ear: External ear normal.  Eyes:     Conjunctiva/sclera: Conjunctivae normal.  Cardiovascular:     Rate and Rhythm: Normal rate and regular rhythm.     Pulses: Normal pulses.      Heart sounds: Normal heart sounds.  Pulmonary:     Effort: Pulmonary effort is normal.     Breath sounds: Normal breath sounds.  Abdominal:     General: Abdomen is flat. Bowel sounds are normal.     Palpations: Abdomen is soft.  Musculoskeletal:     Right lower leg: No edema.     Left lower leg: No edema.  Lymphadenopathy:     Cervical: No cervical adenopathy.  Skin:    General: Skin is warm and dry.  Neurological:     Mental Status: He is alert.    Most recent functional status assessment: In your present state of health, do you have any difficulty performing the following activities: 02/18/2021  Hearing? N  Vision? N  Difficulty concentrating or making decisions? N  Walking or climbing stairs? N  Dressing or bathing? N  Doing errands, shopping? N  Some recent data might be hidden   Most recent fall risk assessment: Fall Risk  02/18/2021  Falls in the past year? 0  Number falls in past yr: 0  Injury with Fall? 0  Risk for fall due to : No Fall Risks  Follow up Falls evaluation completed    Most recent depression screenings: PHQ 2/9 Scores 02/18/2021 09/17/2020  PHQ - 2 Score 0 1  PHQ- 9 Score 2 3   Most recent cognitive screening: No flowsheet data found. Most recent Audit-C alcohol use screening Alcohol Use Disorder Test (AUDIT) 02/18/2021  1. How often do you have a drink containing alcohol? 3  2. How many drinks containing alcohol do you have on a typical day when you are drinking? 0  3. How often do you have six or more drinks on one occasion? 0  AUDIT-C Score 3  4. How often during the last year have you found that you were not able to stop drinking once you had started? 0  5. How often during the last year have you failed to do what was normally expected from you because of drinking? 0  6. How often during the last year have you needed a first drink in the morning to get yourself going after a heavy drinking session? 0  7. How often during the last year have you  had a feeling of guilt of remorse after drinking? 0  8. How often during the last year have you been unable to remember what happened the night before because you had been drinking? 0  9. Have you or someone else been injured as a result of your drinking? 0  10. Has a relative or friend or a doctor or another health worker been concerned about your drinking or suggested you cut down? 0  Alcohol Use Disorder Identification Test Final Score (AUDIT) 3  Alcohol Brief Interventions/Follow-up -   A score of 3 or more in women, and 4 or more in men indicates increased risk  for alcohol abuse, EXCEPT if all of the points are from question 1   No results found for any visits on 02/18/21.  Assessment & Plan     Annual wellness visit done today including the all of the following: Reviewed patient's Family Medical History Reviewed and updated list of patient's medical providers Assessment of cognitive impairment was done Assessed patient's functional ability Established a written schedule for health screening [place] Completed and Reviewed  Exercise Activities and Dietary recommendations  Goals   None     Immunization History  Administered Date(s) Administered   DTaP 02/01/1988, 04/08/1988, 06/30/1988, 10/13/1989, 09/25/1992   Hepatitis B 04/12/1999, 05/17/1999, 10/11/1999   IPV 02/01/1988, 04/08/1988, 06/30/1988, 10/13/1989, 09/25/1992   MMR 03/10/1989, 09/25/1992   Tdap 10/28/2010    Health Maintenance  Topic Date Due   TETANUS/TDAP  10/27/2020   INFLUENZA VACCINE  02/01/2021   Hepatitis C Screening  Completed   HIV Screening  Completed   HPV VACCINES  Aged Out   Pneumococcal Vaccine 15-54 Years old  Discontinued     Discussed health benefits of physical activity, and encouraged him to engage in regular exercise appropriate for his age and condition.    Problem List Items Addressed This Visit       Respiratory   Non-seasonal allergic rhinitis    -  Chronic, poorly controlled - Start Atrovent nasal spray BID - Continue Zyrtec, Flonase, & Albuterol prn for allergy flares        Digestive   GERD (gastroesophageal reflux disease)    - Chronic, well-controlled with occasional breakthrough symptoms - Continue Omeprazole daily - Counseled pt. to use TUMS prn for reflux relief during acute exacerbations        Other   Anxiety    - Stable and well-controlled - Continue Xanax sparingly prn for anxiety attacks      Relevant Medications   ALPRAZolam (XANAX) 1 MG tablet   Overweight   - Chronic and stable - Discussed exercise and diet - Discussed importance of healthy weight management - Recheck lipids, CMP    Relevant Orders   Comprehensive metabolic panel   Lipid panel   Jaw pain, non-TMJ   - Acute, uncomplicated jaw pain - Tenderness of bilateral masseters 2/2 stress-induced jaw clenching - Recommended masseter Botox and/or regular use of a mouth guard to prevent jaw clenching - Will continue to monitor    Hyperglycemia   - History of mild, intermittent hyperglycemia - Discussed diet and exercise - Recheck A1c    Relevant Orders   Hemoglobin A1c   Other Visit Diagnoses     Encounter for annual physical exam    -  Primary   - Pt. declines Tdap booster at this time; recommended pt. to get Tdap booster at next visit or sooner at pharmacy - Check CMP, lipids, A1c    Relevant Orders   Comprehensive metabolic panel   Lipid panel   Hemoglobin A1c        Return in about 6 months (around 08/21/2021) for chronic disease f/u.     Percell Locus, MS3    Patient seen along with MS3 student Percell Locus. I personally evaluated this patient along with the student, and verified all aspects of the history, physical exam, and medical decision making as documented by the student. I agree with the student's documentation and have made all necessary edits.  Jayel Inks, Dionne Bucy, MD, MPH [facility]

## 2021-02-18 NOTE — Assessment & Plan Note (Signed)
-   Chronic, poorly controlled - Start Atrovent nasal spray BID - Continue Zyrtec, Flonase, & Albuterol

## 2021-02-19 LAB — COMPREHENSIVE METABOLIC PANEL
ALT: 25 IU/L (ref 0–44)
AST: 24 IU/L (ref 0–40)
Albumin/Globulin Ratio: 2.3 — ABNORMAL HIGH (ref 1.2–2.2)
Albumin: 5 g/dL (ref 4.0–5.0)
Alkaline Phosphatase: 77 IU/L (ref 44–121)
BUN/Creatinine Ratio: 12 (ref 9–20)
BUN: 13 mg/dL (ref 6–20)
Bilirubin Total: 0.4 mg/dL (ref 0.0–1.2)
CO2: 22 mmol/L (ref 20–29)
Calcium: 9.9 mg/dL (ref 8.7–10.2)
Chloride: 102 mmol/L (ref 96–106)
Creatinine, Ser: 1.09 mg/dL (ref 0.76–1.27)
Globulin, Total: 2.2 g/dL (ref 1.5–4.5)
Glucose: 85 mg/dL (ref 65–99)
Potassium: 4.1 mmol/L (ref 3.5–5.2)
Sodium: 140 mmol/L (ref 134–144)
Total Protein: 7.2 g/dL (ref 6.0–8.5)
eGFR: 92 mL/min/{1.73_m2} (ref >59)

## 2021-02-19 LAB — LIPID PANEL
Chol/HDL Ratio: 5.7 ratio — ABNORMAL HIGH (ref 0.0–5.0)
Cholesterol, Total: 252 mg/dL — ABNORMAL HIGH (ref 100–199)
HDL: 44 mg/dL (ref >39)
LDL Chol Calc (NIH): 126 mg/dL — ABNORMAL HIGH (ref 0–99)
Triglycerides: 461 mg/dL — ABNORMAL HIGH (ref 0–149)
VLDL Cholesterol Cal: 82 mg/dL — ABNORMAL HIGH (ref 5–40)

## 2021-03-01 ENCOUNTER — Telehealth: Payer: Self-pay | Admitting: Family Medicine

## 2021-03-01 DIAGNOSIS — K219 Gastro-esophageal reflux disease without esophagitis: Secondary | ICD-10-CM

## 2021-03-01 MED ORDER — OMEPRAZOLE 20 MG PO CPDR: 20.0000 mg | DELAYED_RELEASE_CAPSULE | Freq: Every day | ORAL | 3 refills | Status: DC

## 2021-03-01 NOTE — Telephone Encounter (Signed)
[facility] faxed refill request for the following medications:   omeprazole (PRILOSEC) 20 MG capsule  Please advise.  

## 2021-03-05 ENCOUNTER — Other Ambulatory Visit: Payer: Self-pay

## 2021-03-05 ENCOUNTER — Telehealth: Payer: Self-pay

## 2021-03-05 MED ORDER — PAXLOVID (300/100) 20 X 150 MG & 10 X 100MG PO TBPK
ORAL_TABLET | ORAL | 0 refills | Status: DC
  Filled 2021-03-05: qty 30, 5d supply, fill #0

## 2021-03-05 NOTE — Telephone Encounter (Signed)
I called and spoke with patient. His symptoms started 3 days ago (Tuesday 03/02/2021). Symptoms are listed in previous message. Patient tested positive for COVID 2 days ago (Wednesday 03/03/2021). I gave patient the phone number to contact [facility] pharmacy for a prescription for an antiviral.

## 2021-03-05 NOTE — Telephone Encounter (Signed)
Pt requested a call back to go over his symptoms and wanted to know if anything will be sent in for him, please advise.

## 2021-03-05 NOTE — Telephone Encounter (Signed)
Copied from CRM [id number]. Topic: General - Other >> Mar 05, 2021  8:52 AM [name] [name] A wrote: Reason for CRM: Patient has tested positive for COVID 19 on Wednesday via at home test   The patient is experiencing sore throat, cough, body aches and and congestion   The patient would like to be prescribed something to help with symptoms if possible  Please contact further if needed

## 2021-03-05 NOTE — Telephone Encounter (Signed)
Outpatient Pharmacy Oral COVID Treatment Note  I connected with [name] [name] on 03/05/2021/3:33 PM by telephone and verified that I am speaking with the correct person using two identifiers.  I discussed the limitations, risks, security, and privacy concerns of performing an evaluation and management service by telephone and the availability of in person appointments via referral to a physician. The patient expressed understanding and agreed to proceed.  Pharmacy location: [place]  Diagnosis: COVID-19 infection  Purpose of visit: Discussion of potential use of Paxlovid, a new treatment for mild to moderate COVID-19 viral infection in non-hospitalized patients.  Subjective/Objective: Patient is a 35 y.o. male who is presenting with COVID 19 viral infection.  COVID 19 viral infection. Their symptoms began on 03/02/21 with cold symptoms.  The patient has confirmed COVID-19 via a home test 03/03/21.   Past Medical History:  Diagnosis Date   Acid reflux    Allergy    Anxiety    Bicuspid aortic valve 04/2020   Heart murmur      Allergies  Allergen Reactions   Erythromycin Rash     Current Outpatient Medications:    albuterol (VENTOLIN HFA) 108 (90 Base) MCG/ACT inhaler, Inhale 2 puffs into the lungs every 6 (six) hours as needed for wheezing or shortness of breath., Disp: 8 g, Rfl: 2   ALPRAZolam (XANAX) 1 MG tablet, Take 1 tablet (1 mg total) by mouth daily as needed., Disp: 15 tablet, Rfl: 0   cetirizine (ZYRTEC) 10 MG tablet, Take 10 mg by mouth daily., Disp: , Rfl:    fluticasone (FLONASE) 50 MCG/ACT nasal spray, Place into both nostrils daily., Disp: , Rfl:    ipratropium (ATROVENT) 0.03 % nasal spray, Place 2 sprays into both nostrils every 12 (twelve) hours., Disp: 30 mL, Rfl: 12   ketoconazole (NIZORAL) 2 % cream, Apply 1 application topically 2 (two) times daily., Disp: 60 g, Rfl: 1   nirmatrelvir & ritonavir (PAXLOVID, 300/100,) 20 x 150 MG & 10 x  100MG TBPK, Take 2 tablets of nirmatrelvir and 1 tablet of ritonavir by mouth twice daily., Disp: 30 tablet, Rfl: 0   omeprazole (PRILOSEC) 20 MG capsule, Take 1 capsule (20 mg total) by mouth daily., Disp: 90 capsule, Rfl: 3  Lab Monitoring: eGFR 92 02/18/21  Drug Interactions Noted: Alprazolam rarely used.  Plan:  This patient is a 35 y.o. male that meets the criteria for Emergency Use Authorization of Paxlovid. After reviewing the emergency use authorization with the patient, the patient agrees to receive Paxlovid.  Through FDA guidance and current [place] standing order Paxlovid will be prescribed to the patient.   Patient contacted for counseling on 03/05/21 and verbalized understanding.   Delivery or Pick-Up Date: 03/05/21  Follow up instructions:    Take prescription BID x 5 days as directed Counseling was provided by pharmacist. Reach out to pharmacist with follow up questions For concerns regarding further COVID symptoms please follow up with your PCP or urgent care For urgent or life-threatening issues, seek care at your local emergency department   Toniqua Melamed J 03/05/2021, 3:33 PM [place] Pharmacist Phone# [phone]

## 2021-03-09 ENCOUNTER — Other Ambulatory Visit: Payer: Self-pay

## 2021-03-09 NOTE — Telephone Encounter (Signed)
Patient did not pick up Paxlovid on 03/05/21 and is now out of the 5 day window. Paxlovid cancelled and returned to stock.

## 2021-06-14 ENCOUNTER — Telehealth (INDEPENDENT_AMBULATORY_CARE_PROVIDER_SITE_OTHER): Payer: 59 | Admitting: Family Medicine

## 2021-06-14 DIAGNOSIS — Z91199 Patient's noncompliance with other medical treatment and regimen due to unspecified reason: Secondary | ICD-10-CM

## 2021-06-14 NOTE — Progress Notes (Signed)
Appointment cancelled

## 2021-06-15 IMAGING — CR DG CHEST 2V
1 series · 2 of 2 positions shown · non-contrast
Comparison: Radiograph 03/04/2020

CLINICAL DATA: COVID in Wednesday June, 2020 with persistent cough

EXAM:
CHEST - 2 VIEW

[Series 1: dg chest 2 view · 0.14mm/px · 2 of 2 slices shown]
[im 1/2]
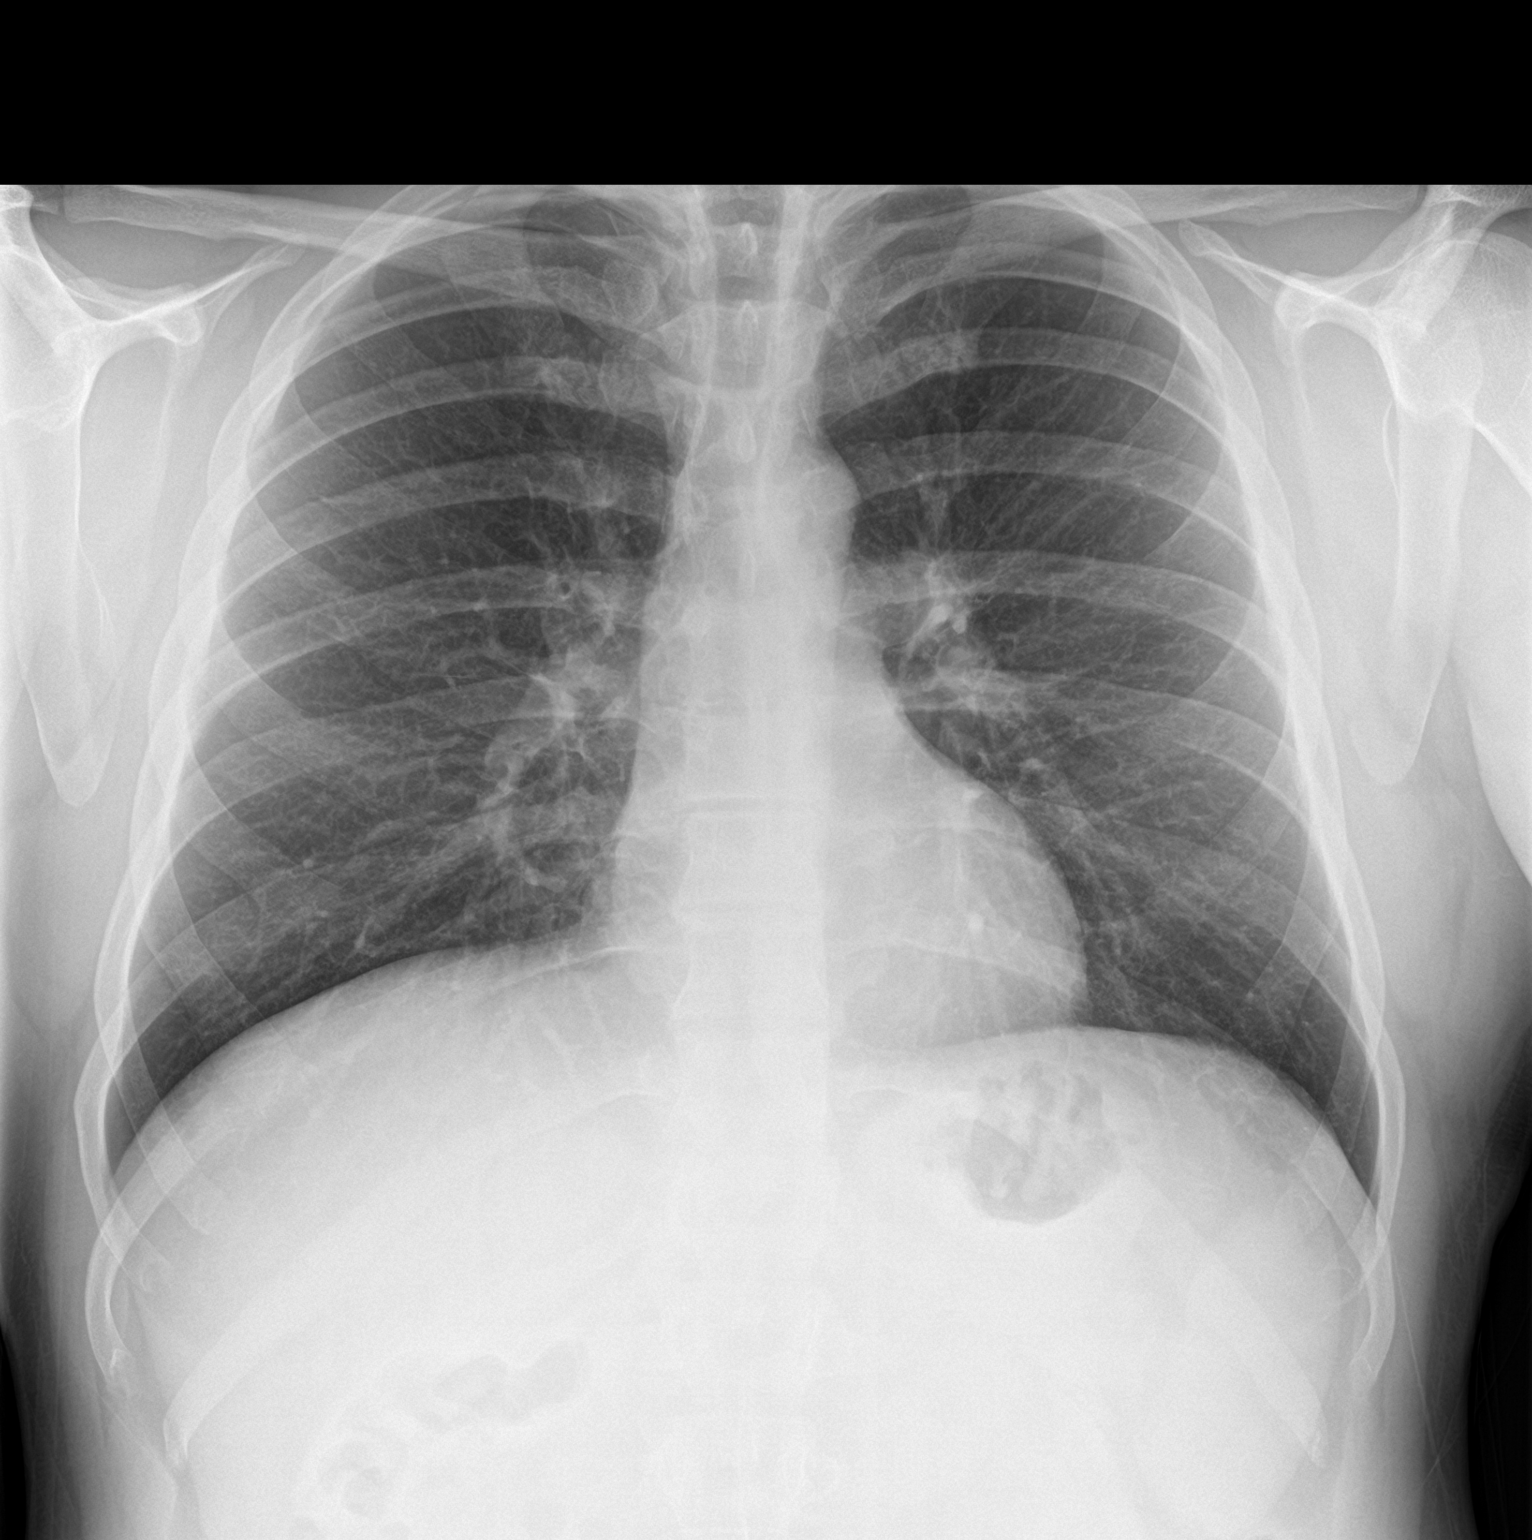
[im 2/2]
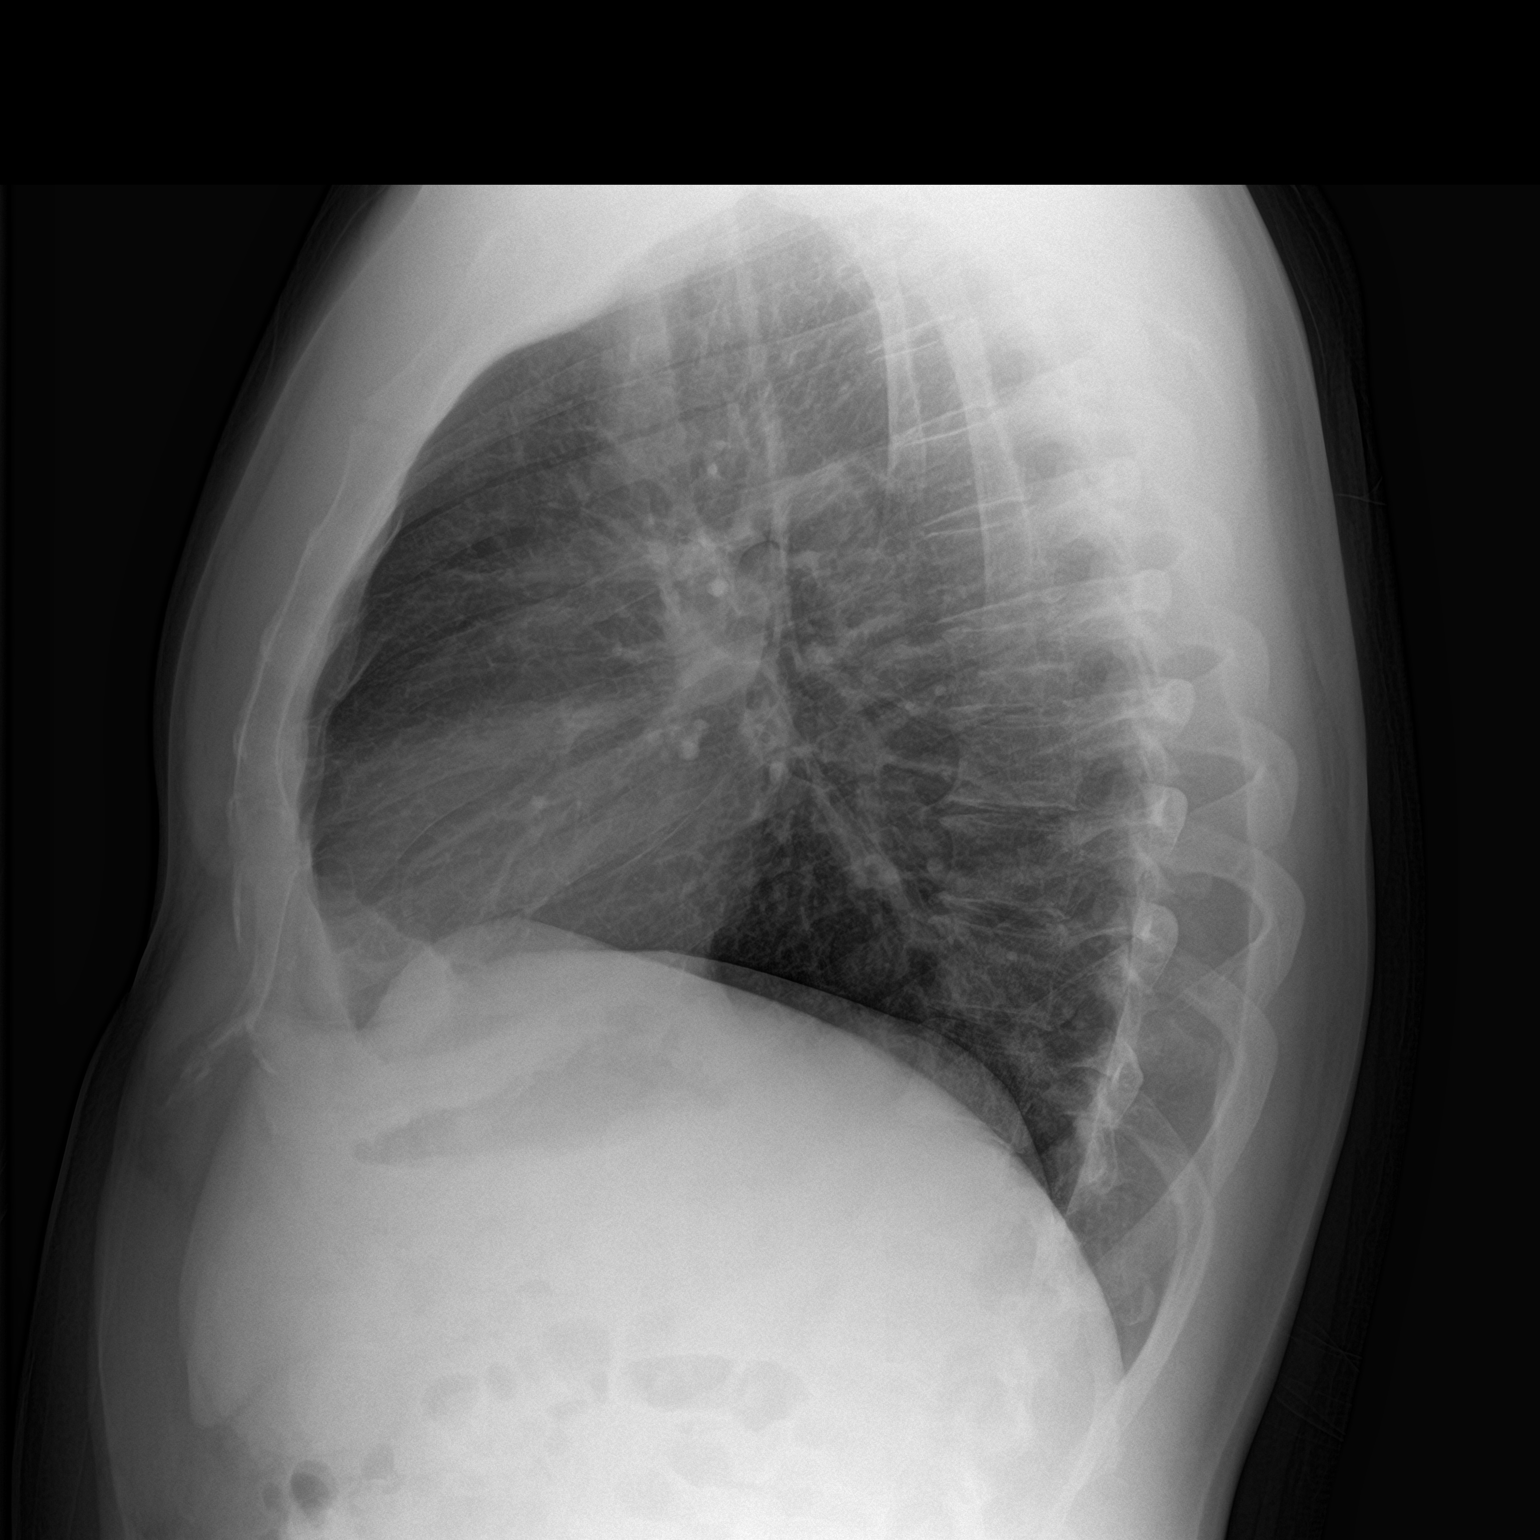

[2 of 2 positions shown; findings below may reference images not displayed]

FINDINGS: No consolidation, features of edema, pneumothorax, or effusion.
Pulmonary vascularity is normally distributed. The cardiomediastinal
contours are unremarkable. No acute osseous or soft tissue
abnormality.
IMPRESSION: Unremarkable chest radiograph

## 2021-07-06 ENCOUNTER — Other Ambulatory Visit: Payer: Self-pay

## 2021-07-18 ENCOUNTER — Other Ambulatory Visit: Payer: Self-pay | Admitting: Family Medicine

## 2021-07-18 DIAGNOSIS — F419 Anxiety disorder, unspecified: Secondary | ICD-10-CM

## 2021-07-18 NOTE — Telephone Encounter (Signed)
Requested medication (s) are due for refill today: yes  Requested medication (s) are on the active medication list: yes  Last refill:  02/18/21 #15 tablets  Future visit scheduled: yes  Notes to clinic:  med not delegated to NT to RF   Requested Prescriptions  Pending Prescriptions Disp Refills   ALPRAZolam (XANAX) 1 MG tablet [Pharmacy Med Name: ALPRAZOLAM 1 MG TABLET] 15 tablet 0    Sig: TAKE 1 TABLET BY MOUTH DAILY AS NEEDED.     Not Delegated - Psychiatry:  Anxiolytics/Hypnotics Failed - 07/18/2021  2:03 PM      Failed - This refill cannot be delegated      Failed - Urine Drug Screen completed in last 360 days      Passed - Valid encounter within last 6 months    Recent Outpatient Visits           1 month ago No-show for appointment   [facility] Malva Limes, MD   5 months ago Encounter for annual physical exam   [facility] [place], Marzella Schlein, MD   8 months ago Anxiety   [facility], Marzella Schlein, MD   10 months ago Flushing   [facility] [place], Marzella Schlein, MD   10 months ago Witnessed seizure-like activity [facility])   [facility] [place], Marzella Schlein, MD       Future Appointments             In 1 month Bacigalupo, Marzella Schlein, MD [facility]

## 2021-08-26 ENCOUNTER — Ambulatory Visit: Payer: 59 | Admitting: Family Medicine

## 2021-08-26 NOTE — Progress Notes (Unsigned)
Established patient visit   Patient: [name] [name]   DOB: [birth year]   36 y.o. Male  MRN: [id number] Visit Date: 08/26/2021  Today's healthcare provider: Shirlee Latch, MD   No chief complaint on file.  Subjective    HPI  Follow up for GERD  The patient was last seen for this 6 months ago. Changes made at last visit include;  Continue Omeprazole daily. Counseled pt. to use TUMS prn for reflux relief during acute exacerbations.  He reports {excellent/good/fair/poor:19665} compliance with treatment. He feels that condition is {improved/worse/unchanged:3041574}. He {is/is not:21021397} having side effects. ***  ----------------------------------------------------------------------------------------- Anxiety, Follow-up  He was last seen for anxiety 6 months ago. Changes made at last visit include no changes, continue Xanax prn for anxiety attacks.   He reports {excellent/good/fair/poor:19665} compliance with treatment. He reports {good/fair/poor:18685} tolerance of treatment. He {is/is not:21021397} having side effects. {document side effects if present:1}  He feels his anxiety is {Desc; severity:60313} and {improved/worse/unchanged:3041574} since last visit.  Symptoms: {Yes/No:20286} chest pain {Yes/No:20286} difficulty concentrating  {Yes/No:20286} dizziness {Yes/No:20286} fatigue  {Yes/No:20286} feelings of losing control {Yes/No:20286} insomnia  {Yes/No:20286} irritable {Yes/No:20286} palpitations  {Yes/No:20286} panic attacks {Yes/No:20286} racing thoughts  {Yes/No:20286} shortness of breath {Yes/No:20286} sweating  {Yes/No:20286} tremors/shakes    GAD-7 Results GAD-7 Generalized Anxiety Disorder Screening Tool 07/17/2020 06/08/2018 09/18/2017  1. Feeling Nervous, Anxious, or on Edge 1 1 1   2. Not Being Able to Stop or Control Worrying 0 1 1  3. Worrying Too Much About Different Things 1 1 1   4. Trouble Relaxing 1 1 0  5. Being So Restless it's Hard To Sit  Still 0 1 0  6. Becoming Easily Annoyed or Irritable 0 1 1  7. Feeling Afraid As If Something Awful Might Happen 0 0 1  Total GAD-7 Score 3 6 5   Difficulty At Work, Home, or Getting  Along With Others? Not difficult at all Not difficult at all -    PHQ-9 Scores PHQ9 SCORE ONLY 02/18/2021 09/17/2020 08/27/2020  PHQ-9 Total Score 2 3 9     ---------------------------------------------------------------------------------------------------  Medications: Outpatient Medications Prior to Visit  Medication Sig   albuterol (VENTOLIN HFA) 108 (90 Base) MCG/ACT inhaler Inhale 2 puffs into the lungs every 6 (six) hours as needed for wheezing or shortness of breath.   ALPRAZolam (XANAX) 1 MG tablet TAKE 1 TABLET BY MOUTH DAILY AS NEEDED.   cetirizine (ZYRTEC) 10 MG tablet Take 10 mg by mouth daily.   fluticasone (FLONASE) 50 MCG/ACT nasal spray Place into both nostrils daily.   ipratropium (ATROVENT) 0.03 % nasal spray Place 2 sprays into both nostrils every 12 (twelve) hours.   ketoconazole (NIZORAL) 2 % cream Apply 1 application topically 2 (two) times daily.   nirmatrelvir & ritonavir (PAXLOVID, 300/100,) 20 x 150 MG & 10 x 100MG  TBPK Take 2 tablets of nirmatrelvir and 1 tablet of ritonavir by mouth twice daily.   omeprazole (PRILOSEC) 20 MG capsule Take 1 capsule (20 mg total) by mouth daily.   No facility-administered medications prior to visit.    Review of Systems  All other systems reviewed and are negative.  {Labs   Heme   Chem   Endocrine   Serology   Results Review (optional):23779}   Objective    There were no vitals taken for this visit. {Show previous vital signs (optional):23777}  Physical Exam  ***  No results found for any visits on 08/26/21.  Assessment & Plan     ***  No  follow-ups on file.      {provider attestation***:1}   Shirlee Latch, MD  [facility] [phone] (phone) [phone] (fax)  [facility]

## 2021-10-28 ENCOUNTER — Emergency Department (HOSPITAL_COMMUNITY): Payer: 59

## 2021-10-28 ENCOUNTER — Encounter (HOSPITAL_COMMUNITY): Payer: Self-pay

## 2021-10-28 ENCOUNTER — Other Ambulatory Visit: Payer: Self-pay

## 2021-10-28 ENCOUNTER — Emergency Department (HOSPITAL_COMMUNITY)
Admission: EM | Admit: 2021-10-28 | Discharge: 2021-10-28 | Disposition: A | Discharge status: Home / Self Care | Payer: 59 | Attending: Emergency Medicine | Admitting: Emergency Medicine

## 2021-10-28 DIAGNOSIS — M79604 Pain in right leg: Secondary | ICD-10-CM | POA: Insufficient documentation

## 2021-10-28 DIAGNOSIS — M5416 Radiculopathy, lumbar region: Secondary | ICD-10-CM | POA: Diagnosis not present

## 2021-10-28 DIAGNOSIS — M545 Low back pain, unspecified: Secondary | ICD-10-CM | POA: Diagnosis present

## 2021-10-28 MED ORDER — LORAZEPAM 1 MG PO TABS: 0.5000 mg | ORAL_TABLET | Freq: Once | ORAL | Status: DC | PRN

## 2021-10-28 MED ORDER — HYDROCODONE-ACETAMINOPHEN 5-325 MG PO TABS
1.0000 | ORAL_TABLET | Freq: Once | ORAL | Status: AC
  Administered 2021-10-28: 1 via ORAL
  Filled 2021-10-28: qty 1

## 2021-10-28 NOTE — ED Provider Notes (Signed)
?[facility] ?Provider Note ? ? ?CSN: [id number] ?Arrival date & time: 10/28/21  1146 ? ?  ? ?History ? ?Chief Complaint  ?Patient presents with  ? Back Pain  ? ? ?[name] [name] is a 36 y.o. male.  Patient presents to the hospital via EMS complaining of low back pain, right-sided leg pain, difficulty with ambulation.  Patient states that he has been undergoing conservative therapy for bulging disks from L4, L5, and S1.  Patient states that plans are to have him see physiatry on May 8.  Pain has increased greatly since Tuesday. Patient states that this AM he had some difficulty with continence. Unclear if this was due to difficulty with ambulation or if true incontinence. Patient endorses low back back pain, pain down the right leg to the level of the calf, difficulty ambulating due to pain, possible incontinence, numbness around the right groin region. Denies new injury, shortness of breath. ? ?HPI ? ?  ? ?Home Medications ?Prior to Admission medications   ?Medication Sig Start Date End Date Taking? Authorizing Provider  ?albuterol (VENTOLIN HFA) 108 (90 Base) MCG/ACT inhaler Inhale 2 puffs into the lungs every 6 (six) hours as needed for wheezing or shortness of breath. 07/27/20   Bacigalupo, Dionne Bucy, MD  ?ALPRAZolam Duanne Moron) 1 MG tablet TAKE 1 TABLET BY MOUTH DAILY AS NEEDED. 07/19/21   Bacigalupo, Dionne Bucy, MD  ?cetirizine (ZYRTEC) 10 MG tablet Take 10 mg by mouth daily.    [provider]  ?fluticasone (FLONASE) 50 MCG/ACT nasal spray Place into both nostrils daily.    [provider]  ?ipratropium (ATROVENT) 0.03 % nasal spray Place 2 sprays into both nostrils every 12 (twelve) hours. 02/18/21   Virginia Crews, MD  ?ketoconazole (NIZORAL) 2 % cream Apply 1 application topically 2 (two) times daily. 08/13/20   Flinchum, Kelby Aline, FNP  ?nirmatrelvir & ritonavir (PAXLOVID, 300/100,) 20 x 150 MG & 10 x 100MG  TBPK Take 2 tablets of nirmatrelvir and 1 tablet  of ritonavir by mouth twice daily. 03/05/21   Juanito Doom, MD  ?omeprazole (PRILOSEC) 20 MG capsule Take 1 capsule (20 mg total) by mouth daily. 03/01/21   Virginia Crews, MD  ?   ? ?Allergies    ?Erythromycin   ? ?Review of Systems   ?Review of Systems  ?Constitutional:  Negative for fever.  ?Respiratory:  Negative for shortness of breath.   ?Cardiovascular:  Negative for chest pain.  ?Gastrointestinal:  Negative for abdominal pain.  ?Genitourinary:  Positive for difficulty urinating.  ?Musculoskeletal:  Positive for arthralgias, back pain and gait problem.  ? ?Physical Exam ?Updated Vital Signs ?BP (!) 148/103 (BP Location: Left Arm)   Pulse 81   Temp 98.5 ?F (36.9 ?C) (Oral)   Resp 14   Ht 6' (1.829 m)   Wt 92.9 kg   SpO2 97%   BMI 27.78 kg/m?  ?Physical Exam ?Vitals and nursing note reviewed.  ?Constitutional:   ?   Appearance: He is normal weight.  ?   Comments: Patient appears uncomfortable  ?HENT:  ?   Head: Normocephalic.  ?Eyes:  ?   Extraocular Movements: Extraocular movements intact.  ?   Conjunctiva/sclera: Conjunctivae normal.  ?   Pupils: Pupils are equal, round, and reactive to light.  ?Cardiovascular:  ?   Rate and Rhythm: Normal rate and regular rhythm.  ?   Pulses: Normal pulses.  ?Pulmonary:  ?   Effort: Pulmonary effort is normal.  ?  Breath sounds: Normal breath sounds.  ?Abdominal:  ?   Palpations: Abdomen is soft.  ?   Tenderness: There is no abdominal tenderness.  ?Musculoskeletal:     ?   General: No swelling or tenderness.  ?   Cervical back: Normal range of motion.  ?   Right lower leg: No edema.  ?   Left lower leg: No edema.  ?Skin: ?   General: Skin is warm and dry.  ?Neurological:  ?   Mental Status: He is alert.  ?   Comments: CN II-VII, XI, XII intact ?Decreased strength on right side when compared to left ?Sensation equal bilaterally ?Coordination intact ?  ? ? ?ED Results / Procedures / Treatments   ?Labs ?(all labs ordered are listed, but only abnormal results  are displayed) ?Labs Reviewed - No data to display ? ?EKG ?None ? ?Radiology ?MR LUMBAR SPINE WO CONTRAST ? ?Result Date: 10/28/2021 ?CLINICAL DATA:  Low back pain for over 6 weeks. EXAM: MRI LUMBAR SPINE WITHOUT CONTRAST TECHNIQUE: Multiplanar, multisequence MR imaging of the lumbar spine was performed. No intravenous contrast was administered. COMPARISON:  None. FINDINGS: Segmentation:  Standard. Alignment:  Physiologic. Vertebrae: No acute fracture, evidence of discitis, or aggressive bone lesion. Conus medullaris and cauda equina: Conus extends to the T12-L1 level. Conus and cauda equina appear normal. Paraspinal and other soft tissues: No acute paraspinal abnormality. Disc levels: Disc spaces: Degenerative disease with disc height loss at L5-S1. Disc desiccation at L4-5. T12-L1: No significant disc bulge. No neural foraminal stenosis. No central canal stenosis. L1-L2: No significant disc bulge. No neural foraminal stenosis. No central canal stenosis. L2-L3: No significant disc bulge. No neural foraminal stenosis. No central canal stenosis. L3-L4: No significant disc bulge. No neural foraminal stenosis. No central canal stenosis. L4-L5: Mild broad-based disc bulge. No foraminal or central canal stenosis. L5-S1: Broad-based disc bulge with a broad right paracentral disc protrusion with mass effect on the right intraspinal S1 nerve root. No foraminal or central canal stenosis. IMPRESSION: 1. At L5-S1 there is a broad-based disc bulge with a broad right paracentral disc protrusion with mass effect on the right intraspinal S1 nerve root. Electronically Signed   By: Kathreen Devoid M.D.   On: 10/28/2021 13:10   ? ?Procedures ?Procedures  ? ? ?Medications Ordered in ED ?Medications  ?LORazepam (ATIVAN) tablet 0.5 mg (has no administration in time range)  ?HYDROcodone-acetaminophen (NORCO/VICODIN) 5-325 MG per tablet 1 tablet (1 tablet Oral Given 10/28/21 1220)  ? ? ?ED Course/ Medical Decision Making/ A&P ?  ?                         ?Medical Decision Making ?Amount and/or Complexity of Data Reviewed ?Radiology: ordered. ? ?Risk ?Prescription drug management. ? ? ?This patient presents to the ED for concern of back pain, this involves an extensive number of treatment options, and is a complaint that carries with it a high risk of complications and morbidity.  The differential diagnosis includes but is not limited to degenerative disc disease, herniated disc, ruptured disc, cauda equina, and others ? ? ?Co morbidities that complicate the patient evaluation ? ?History of degenerative disc disease in lumbar and sacral spine ? ? ?Additional history obtained: ? ?Additional history obtained from EMS ? ? ?Imaging Studies ordered: ? ?I ordered imaging studies including MR lumbar spine ?I independently visualized and interpreted imaging which showed L5-S1 broad based disc bulge with a broad right paracentral disc protrusion with  mass effect on the right intraspinal S1 nerve root ?I agree with the radiologist interpretation ? ? ? ?Problem List / ED Course / Critical interventions / Medication management ? ? ?I ordered medication including hydrocodone for pain ?Reevaluation of the patient after these medicines showed that the patient improved ?I have reviewed the patients home medicines and have made adjustments as needed ? ? ? ?Test / Admission - Considered: ? ?MRI of the lumbar spine showed no sign of cauda equina.  Imaging does confirm L5-S1 disc bulge with mass effect on right S1 nerve root.  This would be consistent with the patient's pain pattern.  The patient is currently treated by [facility] in [place] and has an appointment to see physiatry in May.  [facility] is currently prescribed steroids, anti-inflammatories, gabapentin, and tramadol for the patient's pain.  I see no medication that I could add that would be of benefit to the patient at this time.  Recommend discharge home and follow-up with [facility].  Discussed plan with  patient who voiced understanding. ? ?Final Clinical Impression(s) / ED Diagnoses ?Final diagnoses:  ?Lumbar radiculopathy  ? ? ?Rx / DC Orders ?ED Discharge Orders   ? ? None  ? ?  ? ? ?  ?Dorothyann Peng,

## 2021-10-28 NOTE — ED Triage Notes (Signed)
Pt arrived via [facility] from home for increased back pain. Pt was dx w/herniated disks of L4-5 and S1. Pt c/o right leg weakness. ?

## 2021-10-28 NOTE — ED Notes (Signed)
Pt has hx of herniated discs and does PT regularly. He reports he was walking on Tuesday and felt pain all of sudden. Since then it has been difficult getting out of bed and he has had difficulties urinating. Pt reports numbness in right toes. He is able to move both lower extremities and wiggle toes  ?

## 2021-10-28 NOTE — Discharge Instructions (Signed)
You were seen today for back pain.  Imaging confirmed a mild disc bulge at the L4-L5 level and a broad-based disc bulge at the L5-S1 level with pressure on the S1 nerve root on the right side.  The medications you are currently prescribed are the outpatient treatment of choice for your condition.  Recommend follow-up with your orthopedic provider for further recommendations. ?

## 2021-11-17 HISTORY — PX: LUMBAR DISC SURGERY: SHX700

## 2021-12-21 ENCOUNTER — Other Ambulatory Visit: Payer: Self-pay | Admitting: Family Medicine

## 2021-12-21 DIAGNOSIS — F419 Anxiety disorder, unspecified: Secondary | ICD-10-CM

## 2021-12-21 NOTE — Telephone Encounter (Signed)
Requested medications are due for refill today.  yes  Requested medications are on the active medications list.  yes  Last refill. 07/19/2021 #15 0 refills  Future visit scheduled.   yes  Notes to clinic.  Medication refill is not delegated.    Requested Prescriptions  Pending Prescriptions Disp Refills   ALPRAZolam (XANAX) 1 MG tablet [Pharmacy Med Name: ALPRAZOLAM 1 MG TABLET] 15 tablet 0    Sig: TAKE 1 TABLET BY MOUTH EVERY DAY AS NEEDED     Not Delegated - Psychiatry: Anxiolytics/Hypnotics 2 Failed - 12/21/2021 11:23 AM      Failed - This refill cannot be delegated      Failed - Urine Drug Screen completed in last 360 days      Failed - Valid encounter within last 6 months    Recent Outpatient Visits           6 months ago No-show for appointment   [facility] Malva Limes, MD   10 months ago Encounter for annual physical exam   [facility] [place], Marzella Schlein, MD   1 year ago Anxiety   [facility] [place], Marzella Schlein, MD   1 year ago [place]   [facility] [place], Marzella Schlein, MD   1 year ago Witnessed seizure-like activity [facility])   [facility], Marzella Schlein, MD       Future Appointments             Tomorrow Alfredia Ferguson, PA-C [facility]            Passed - Patient is not pregnant

## 2021-12-22 ENCOUNTER — Encounter: Payer: Self-pay | Admitting: Physician Assistant

## 2021-12-22 ENCOUNTER — Telehealth (INDEPENDENT_AMBULATORY_CARE_PROVIDER_SITE_OTHER): Payer: 59 | Admitting: Physician Assistant

## 2021-12-22 VITALS — BP 130/82 | HR 81 | Ht 70.0 in | Wt 191.8 lb

## 2021-12-22 DIAGNOSIS — J029 Acute pharyngitis, unspecified: Secondary | ICD-10-CM | POA: Diagnosis not present

## 2021-12-22 LAB — POCT RAPID STREP A (OFFICE): Rapid Strep A Screen: NEGATIVE

## 2021-12-22 NOTE — Assessment & Plan Note (Signed)
Rapid strep negative For now supportive treatment; fluids, dayquil, tylenol ,rest.  Will send for strep culture and contact pt with results

## 2021-12-22 NOTE — Progress Notes (Signed)
    I,Sha'taria Tyson,acting as a Neurosurgeon for [employer], PA-C.,have documented all relevant documentation on the behalf of Alfredia Ferguson, PA-C,as directed by  Alfredia Ferguson, PA-C while in the presence of Alfredia Ferguson, PA-C.   Acute Office Visit  Subjective:     Patient ID: [name] [name], male    DOB: [birth year], 36 y.o.   MRN: [id number]  Cc. Sore throat x 3 days   HPI Patient is in today for possible strep.  Please note visit originally was video, but d/t current symptoms pt decided to come into the office for testing and further evaluation. This was recommended clinically.  Sore Throat  The current episode started in the past 7 days. The problem has been gradually improving. There has been no fever. Associated symptoms include congestion and diarrhea. Associated symptoms comments: Throat scratchy and sore, lymph nodes swollen. He has had exposure to strep. Exposure to: all kids had strep in the last month (3). He has tried nothing for the symptoms.   Review of Systems  HENT:  Positive for congestion.   Gastrointestinal:  Positive for diarrhea.        Objective:    BP 130/82 (BP Location: Right Arm, Patient Position: Sitting, Cuff Size: Normal)   Pulse 81   Ht 5\' 10"  (1.778 m)   Wt 191 lb 12.8 oz (87 kg)   SpO2 98%   BMI 27.52 kg/m    Physical Exam Constitutional:      General: He is awake.     Appearance: He is well-developed.  HENT:     Head: Normocephalic.     Mouth/Throat:     Pharynx: Posterior oropharyngeal erythema present. No oropharyngeal exudate.     Tonsils: Tonsillar abscess present. No tonsillar exudate.  Eyes:     Conjunctiva/sclera: Conjunctivae normal.  Cardiovascular:     Rate and Rhythm: Normal rate and regular rhythm.  Pulmonary:     Effort: Pulmonary effort is normal.     Breath sounds: Normal breath sounds.  Skin:    General: Skin is warm.  Neurological:     Mental Status: He is alert and oriented to person, place, and time.   Psychiatric:        Attention and Perception: Attention normal.        Mood and Affect: Mood normal.        Speech: Speech normal.        Behavior: Behavior is cooperative.     Results for orders placed or performed in visit on 12/22/21  POCT rapid strep A  Result Value Ref Range   Rapid Strep A Screen Negative Negative        Assessment & Plan:   Problem List Items Addressed This Visit       Respiratory   Pharyngitis - Primary    Rapid strep negative For now supportive treatment; fluids, dayquil, tylenol ,rest.  Will send for strep culture and contact pt with results      Relevant Orders   POCT rapid strep A (Completed)   Culture, Group A Strep    I, 12/24/21, PA-C have reviewed all documentation for this visit. The documentation on  12/22/2021  for the exam, diagnosis, procedures, and orders are all accurate and complete.  12/24/2021, PA-C [facility] [address] [place], [place], [place] Office: [phone] Fax: [phone]

## 2021-12-25 ENCOUNTER — Telehealth: Payer: 59 | Admitting: Nurse Practitioner

## 2021-12-25 DIAGNOSIS — J0110 Acute frontal sinusitis, unspecified: Secondary | ICD-10-CM | POA: Diagnosis not present

## 2021-12-25 LAB — CULTURE, GROUP A STREP: Strep A Culture: NEGATIVE

## 2021-12-25 LAB — SPECIMEN STATUS REPORT

## 2021-12-25 MED ORDER — AMOXICILLIN-POT CLAVULANATE 875-125 MG PO TABS: 1.0000 | ORAL_TABLET | Freq: Two times a day (BID) | ORAL | 0 refills | Status: AC

## 2022-01-24 ENCOUNTER — Telehealth: Payer: 59 | Admitting: Nurse Practitioner

## 2022-01-24 DIAGNOSIS — J069 Acute upper respiratory infection, unspecified: Secondary | ICD-10-CM | POA: Diagnosis not present

## 2022-01-24 MED ORDER — FLUTICASONE PROPIONATE 50 MCG/ACT NA SUSP: 2.0000 | Freq: Every day | NASAL | 6 refills | Status: AC

## 2022-01-24 MED ORDER — BENZONATATE 100 MG PO CAPS: 100.0000 mg | ORAL_CAPSULE | Freq: Three times a day (TID) | ORAL | 0 refills | Status: DC | PRN

## 2022-01-24 MED ORDER — ALBUTEROL SULFATE HFA 108 (90 BASE) MCG/ACT IN AERS: 2.0000 | INHALATION_SPRAY | Freq: Four times a day (QID) | RESPIRATORY_TRACT | 0 refills | Status: DC | PRN

## 2022-01-24 NOTE — Addendum Note (Signed)
Addended by: Viviano Simas E on: 01/24/2022 10:23 AM   Modules accepted: Orders

## 2022-01-24 NOTE — Progress Notes (Signed)
E-Visit for Upper Respiratory Infection   We are sorry you are not feeling well.  Here is how we plan to help!  Based on what you have shared with me, it looks like you may have a viral upper respiratory infection.  Upper respiratory infections are caused by a large number of viruses; however, rhinovirus is the most common cause.   You may want to consider taking a home COVID test as this is one of the viruses that cause similar symptoms to what you and your family have been experiencing.   Symptoms vary from person to person, with common symptoms including sore throat, cough, fatigue or lack of energy and feeling of general discomfort.  Symptoms vary however, and are closely related to a person's age or underlying illnesses.  The most common symptoms associated with an upper respiratory infection are nasal discharge or congestion, cough, sneezing, headache and pressure in the ears and face.  These symptoms usually persist for about 3 to 10 days, but can last up to 2 weeks.  It is important to know that upper respiratory infections do not cause serious illness or complications in most cases.    Upper respiratory infections can be transmitted from person to person, with the most common method of transmission being a person's hands.  The virus is able to live on the skin and can infect other persons for up to 2 hours after direct contact.  Also, these can be transmitted when someone coughs or sneezes; thus, it is important to cover the mouth to reduce this risk.  To keep the spread of the illness at bay, good hand hygiene is very important.  This is an infection that is most likely caused by a virus. There are no specific treatments other than to help you with the symptoms until the infection runs its course.  We are sorry you are not feeling well.  Here is how we plan to help!   For nasal congestion, you may use an oral decongestants such as Mucinex D or if you have glaucoma or high blood pressure use  plain Mucinex.  Saline nasal spray or nasal drops can help and can safely be used as often as needed for congestion.  For your congestion, I have prescribed Fluticasone nasal spray one spray in each nostril twice a day  If you do not have a history of heart disease, hypertension, diabetes or thyroid disease, prostate/bladder issues or glaucoma, you may also use Sudafed to treat nasal congestion.  It is highly recommended that you consult with a pharmacist or your primary care physician to ensure this medication is safe for you to take.     If you have a cough, you may use cough suppressants such as Delsym and Robitussin.  If you have glaucoma or high blood pressure, you can also use Coricidin HBP.   For cough I have prescribed for you A prescription cough medication called Tessalon Perles 100 mg. You may take 1-2 capsules every 8 hours as needed for cough  If you have a sore or scratchy throat, use a saltwater gargle-  to  teaspoon of salt dissolved in a 4-ounce to 8-ounce glass of warm water.  Gargle the solution for approximately 15-30 seconds and then spit.  It is important not to swallow the solution.  You can also use throat lozenges/cough drops and Chloraseptic spray to help with throat pain or discomfort.  Warm or cold liquids can also be helpful in relieving throat pain.  For  headache, pain or general discomfort, you can use Ibuprofen or Tylenol as directed.   Some authorities believe that zinc sprays or the use of Echinacea may shorten the course of your symptoms.   HOME CARE Only take medications as instructed by your medical team. Be sure to drink plenty of fluids. Water is fine as well as fruit juices, sodas and electrolyte beverages. You may want to stay away from caffeine or alcohol. If you are nauseated, try taking small sips of liquids. How do you know if you are getting enough fluid? Your urine should be a pale yellow or almost colorless. Get rest. Taking a steamy shower or using  a humidifier may help nasal congestion and ease sore throat pain. You can place a towel over your head and breathe in the steam from hot water coming from a faucet. Using a saline nasal spray works much the same way. Cough drops, hard candies and sore throat lozenges may ease your cough. Avoid close contacts especially the very young and the elderly Cover your mouth if you cough or sneeze Always remember to wash your hands.   GET HELP RIGHT AWAY IF: You develop worsening fever. If your symptoms do not improve within 10 days You develop yellow or green discharge from your nose over 3 days. You have coughing fits You develop a severe head ache or visual changes. You develop shortness of breath, difficulty breathing or start having chest pain Your symptoms persist after you have completed your treatment plan  MAKE SURE YOU  Understand these instructions. Will watch your condition. Will get help right away if you are not doing well or get worse.  Thank you for choosing an e-visit.  Your e-visit answers were reviewed by a board certified advanced clinical practitioner to complete your personal care plan. Depending upon the condition, your plan could have included both over the counter or prescription medications.  Please review your pharmacy choice. Make sure the pharmacy is open so you can pick up prescription now. If there is a problem, you may contact your provider through [employer] and have the prescription routed to another pharmacy.  Your safety is important to [place]. If you have drug allergies check your prescription carefully.   For the next 24 hours you can use MyChart to ask questions about today's visit, request a non-urgent call back, or ask for a work or school excuse. You will get an email in the next two days asking about your experience. I hope that your e-visit has been valuable and will speed your recovery.  I spent approximately 7 minutes reviewing the patient's  history, current symptoms and coordinating their plan of care today.    Meds ordered this encounter  Medications   fluticasone (FLONASE) 50 MCG/ACT nasal spray    Sig: Place 2 sprays into both nostrils daily.    Dispense:  16 g    Refill:  6   benzonatate (TESSALON) 100 MG capsule    Sig: Take 1 capsule (100 mg total) by mouth 3 (three) times daily as needed.    Dispense:  30 capsule    Refill:  0

## 2022-01-28 ENCOUNTER — Telehealth: Payer: 59 | Admitting: Physician Assistant

## 2022-01-28 DIAGNOSIS — B9689 Other specified bacterial agents as the cause of diseases classified elsewhere: Secondary | ICD-10-CM

## 2022-01-28 MED ORDER — AMOXICILLIN-POT CLAVULANATE 875-125 MG PO TABS: 1.0000 | ORAL_TABLET | Freq: Two times a day (BID) | ORAL | 0 refills | Status: DC

## 2022-01-28 NOTE — Progress Notes (Signed)

## 2022-02-23 NOTE — Progress Notes (Deleted)
I,Sha'taria Joell Usman,acting as a Neurosurgeon for [employer], PA-C.,have documented all relevant documentation on the behalf of Alfredia Ferguson, PA-C,as directed by  Alfredia Ferguson, PA-C while in the presence of Alfredia Ferguson, PA-C.   Complete physical exam   Patient: [name] [name]   DOB: [birth year]   36 y.o. Male  MRN: [id number] Visit Date: 02/24/2022  Today's healthcare provider: Alfredia Ferguson, PA-C   No chief complaint on file.  Subjective    [name] [name] is a 36 y.o. male who presents today for a complete physical exam.  He reports consuming a {diet types:17450} diet. {Exercise:19826} He generally feels {well/fairly well/poorly:18703}. He reports sleeping {well/fairly well/poorly:18703}. He {does/does not:200015} have additional problems to discuss today.  HPI  ***  Past Medical History:  Diagnosis Date   Acid reflux    Allergy    Anxiety    Bicuspid aortic valve 04/2020   Heart murmur    Past Surgical History:  Procedure Laterality Date   VASECTOMY     Social History   Socioeconomic History   Marital status: Married    Spouse name: Not on file   Number of children: 3   Years of education: Not on file   Highest education level: Not on file  Occupational History    Comment: city of [place]  Tobacco Use   Smoking status: Never   Smokeless tobacco: Current    Types: Chew  Vaping Use   Vaping Use: Never used  Substance and Sexual Activity   Alcohol use: Yes    Alcohol/week: 1.0 - 4.0 standard drink of alcohol    Types: 1 - 4 Cans of beer per week   Drug use: No   Sexual activity: Yes    Partners: Female    Birth control/protection: Surgical  Other Topics Concern   Not on file  Social History Narrative   Not on file   Social Determinants of Health   Financial Resource Strain: Low Risk  (06/01/2017)   Overall Financial Resource Strain (CARDIA)    Difficulty of Paying Living Expenses: Not hard at all  Food Insecurity: No Food Insecurity  (06/01/2017)   Hunger Vital Sign    Worried About Running Out of Food in the Last Year: Never true    Ran Out of Food in the Last Year: Never true  Transportation Needs: No Transportation Needs (06/01/2017)   PRAPARE - Administrator, Civil Service (Medical): No    Lack of Transportation (Non-Medical): No  Physical Activity: Not on file  Stress: Not on file  Social Connections: Not on file  Intimate Partner Violence: Not on file   Family Status  Relation Name Status   Mother  Alive   Father  Alive   Sister  (Not Specified)   PGM  Deceased   PGF  Alive   MGM  Deceased   Neg Hx  (Not Specified)   Family History  Problem Relation Age of Onset   Thyroid disease Mother    Anxiety disorder Mother    Heart murmur Mother    Diabetes Father        pre-diabetes   Hypothyroidism Sister    Cancer Paternal Grandmother        thinks it was liver and pancreatic   Diabetes Paternal Grandfather    Heart Problems Maternal Grandmother    Prostate cancer Neg Hx    Kidney cancer Neg Hx    Bladder Cancer Neg Hx    Breast cancer  Neg Hx    Colon cancer Neg Hx    Allergies  Allergen Reactions   Erythromycin Rash    Patient Care Team: Virginia Crews, MD as PCP - General (Family Medicine) Wellington Hampshire, MD as PCP - Cardiology (Cardiology)   Medications: Outpatient Medications Prior to Visit  Medication Sig   albuterol (VENTOLIN HFA) 108 (90 Base) MCG/ACT inhaler Inhale 2 puffs into the lungs every 6 (six) hours as needed for wheezing or shortness of breath. (Patient not taking: Reported on 12/22/2021)   albuterol (VENTOLIN HFA) 108 (90 Base) MCG/ACT inhaler Inhale 2 puffs into the lungs every 6 (six) hours as needed for wheezing or shortness of breath.   ALPRAZolam (XANAX) 1 MG tablet TAKE 1 TABLET BY MOUTH EVERY DAY AS NEEDED   amoxicillin-clavulanate (AUGMENTIN) 875-125 MG tablet Take 1 tablet by mouth 2 (two) times daily.   benzonatate (TESSALON) 100 MG capsule  Take 1 capsule (100 mg total) by mouth 3 (three) times daily as needed.   cetirizine (ZYRTEC) 10 MG tablet Take 10 mg by mouth daily. (Patient not taking: Reported on 12/22/2021)   fluticasone (FLONASE) 50 MCG/ACT nasal spray Place into both nostrils daily. (Patient not taking: Reported on 12/22/2021)   fluticasone (FLONASE) 50 MCG/ACT nasal spray Place 2 sprays into both nostrils daily.   ipratropium (ATROVENT) 0.03 % nasal spray Place 2 sprays into both nostrils every 12 (twelve) hours. (Patient not taking: Reported on 12/22/2021)   ketoconazole (NIZORAL) 2 % cream Apply 1 application topically 2 (two) times daily. (Patient not taking: Reported on 12/22/2021)   nirmatrelvir & ritonavir (PAXLOVID, 300/100,) 20 x 150 MG & 10 x $Re'100MG'LiB$  TBPK Take 2 tablets of nirmatrelvir and 1 tablet of ritonavir by mouth twice daily. (Patient not taking: Reported on 12/22/2021)   omeprazole (PRILOSEC) 20 MG capsule Take 1 capsule (20 mg total) by mouth daily.   No facility-administered medications prior to visit.    Review of Systems  {Labs  Heme  Chem  Endocrine  Serology  Results Review (optional):23779}  Objective    There were no vitals taken for this visit. {Show previous vital signs (optional):23777}   Physical Exam  ***  Last depression screening scores    02/18/2021    3:22 PM 09/17/2020    3:08 PM 08/27/2020   11:17 AM  PHQ 2/9 Scores  PHQ - 2 Score 0 1 3  PHQ- 9 Score $Remov'2 3 9   'lTIwQx$ Last fall risk screening    02/18/2021    3:23 PM  [place] in the past year? 0  Number falls in past yr: 0  Injury with Fall? 0  Risk for fall due to : No Fall Risks  Follow up Falls evaluation completed   Last Audit-C alcohol use screening    02/18/2021    3:22 PM  Alcohol Use Disorder Test (AUDIT)  1. How often do you have a drink containing alcohol? 3  2. How many drinks containing alcohol do you have on a typical day when you are drinking? 0  3. How often do you have six or more drinks on one  occasion? 0  AUDIT-C Score 3  4. How often during the last year have you found that you were not able to stop drinking once you had started? 0  5. How often during the last year have you failed to do what was normally expected from you because of drinking? 0  6. How often during the last year  have you needed a first drink in the morning to get yourself going after a heavy drinking session? 0  7. How often during the last year have you had a feeling of guilt of remorse after drinking? 0  8. How often during the last year have you been unable to remember what happened the night before because you had been drinking? 0  9. Have you or someone else been injured as a result of your drinking? 0  10. Has a relative or friend or a doctor or another health worker been concerned about your drinking or suggested you cut down? 0  Alcohol Use Disorder Identification Test Final Score (AUDIT) 3   A score of 3 or more in women, and 4 or more in men indicates increased risk for alcohol abuse, EXCEPT if all of the points are from question 1   No results found for any visits on 02/24/22.  Assessment & Plan    Routine Health Maintenance and Physical Exam  Exercise Activities and Dietary recommendations  Goals   None     Immunization History  Administered Date(s) Administered   DTaP 02/01/1988, 04/08/1988, 06/30/1988, 10/13/1989, 09/25/1992   Hepatitis B 04/12/1999, 05/17/1999, 10/11/1999   IPV 02/01/1988, 04/08/1988, 06/30/1988, 10/13/1989, 09/25/1992   MMR 03/10/1989, 09/25/1992   Tdap 10/28/2010    Health Maintenance  Topic Date Due   TETANUS/TDAP  10/27/2020   INFLUENZA VACCINE  02/01/2022   Hepatitis C Screening  Completed   HIV Screening  Completed   HPV VACCINES  Aged Out    Discussed health benefits of physical activity, and encouraged him to engage in regular exercise appropriate for his age and condition.  ***  No follow-ups on file.     {provider attestation***:1}   Mikey Kirschner, PA-C  [facility] [phone] (phone) [phone] (fax)  [place]

## 2022-02-24 ENCOUNTER — Ambulatory Visit: Payer: Self-pay | Admitting: Physician Assistant

## 2022-03-04 ENCOUNTER — Telehealth: Payer: Self-pay | Admitting: Family Medicine

## 2022-03-04 DIAGNOSIS — K219 Gastro-esophageal reflux disease without esophagitis: Secondary | ICD-10-CM

## 2022-03-04 MED ORDER — OMEPRAZOLE 20 MG PO CPDR: 20.0000 mg | DELAYED_RELEASE_CAPSULE | Freq: Every day | ORAL | 3 refills | Status: DC

## 2022-03-04 NOTE — Telephone Encounter (Signed)
[facility] pharmacy faxed refill request for the following medications:     omeprazole (PRILOSEC) 20 MG capsule    Please advise  

## 2022-05-10 ENCOUNTER — Other Ambulatory Visit: Payer: Self-pay | Admitting: Family Medicine

## 2022-05-10 DIAGNOSIS — F419 Anxiety disorder, unspecified: Secondary | ICD-10-CM

## 2022-05-12 ENCOUNTER — Encounter: Payer: Self-pay | Admitting: Family Medicine

## 2022-05-12 DIAGNOSIS — F419 Anxiety disorder, unspecified: Secondary | ICD-10-CM

## 2022-05-12 MED ORDER — ALPRAZOLAM 1 MG PO TABS: 1.0000 mg | ORAL_TABLET | Freq: Every day | ORAL | 0 refills | Status: DC | PRN

## 2022-05-13 MED ORDER — ALPRAZOLAM 1 MG PO TABS: 1.0000 mg | ORAL_TABLET | Freq: Every day | ORAL | 0 refills | Status: DC | PRN

## 2022-06-10 ENCOUNTER — Telehealth: Payer: Self-pay

## 2022-06-10 NOTE — Telephone Encounter (Signed)
Copied from CRM [id number]. Topic: General - Call Back - No Documentation >> Jun 10, 2022  2:54 PM Payton Doughty wrote: Reason for CRM: pt states he was just on phone w/ jennifer at the office. And he was to c/b and speak w/ her.  Pt declined to let me help.

## 2022-06-10 NOTE — Telephone Encounter (Signed)
I had already spoke with pt about wife.  Note in her chart.

## 2022-07-11 ENCOUNTER — Ambulatory Visit (INDEPENDENT_AMBULATORY_CARE_PROVIDER_SITE_OTHER): Payer: 59 | Admitting: Family Medicine

## 2022-07-11 ENCOUNTER — Encounter: Payer: Self-pay | Admitting: Family Medicine

## 2022-07-11 VITALS — BP 122/86 | HR 98 | Temp 98.4°F | Resp 16 | Ht 70.0 in | Wt 206.2 lb

## 2022-07-11 DIAGNOSIS — R739 Hyperglycemia, unspecified: Secondary | ICD-10-CM | POA: Diagnosis not present

## 2022-07-11 DIAGNOSIS — E782 Mixed hyperlipidemia: Secondary | ICD-10-CM | POA: Insufficient documentation

## 2022-07-11 DIAGNOSIS — E559 Vitamin D deficiency, unspecified: Secondary | ICD-10-CM | POA: Diagnosis not present

## 2022-07-11 DIAGNOSIS — Z0000 Encounter for general adult medical examination without abnormal findings: Secondary | ICD-10-CM

## 2022-07-11 DIAGNOSIS — K760 Fatty (change of) liver, not elsewhere classified: Secondary | ICD-10-CM | POA: Diagnosis not present

## 2022-07-11 DIAGNOSIS — K580 Irritable bowel syndrome with diarrhea: Secondary | ICD-10-CM | POA: Insufficient documentation

## 2022-07-11 DIAGNOSIS — E663 Overweight: Secondary | ICD-10-CM

## 2022-07-11 NOTE — Assessment & Plan Note (Signed)
Continue to monitor LFTs 

## 2022-07-11 NOTE — Assessment & Plan Note (Signed)
Longstanding issue No red flags Ok to use imodium prn Recommend low fod map diet

## 2022-07-11 NOTE — Assessment & Plan Note (Signed)
Recommend low carb diet °Recheck A1c  °

## 2022-07-11 NOTE — Assessment & Plan Note (Signed)
Reviewed last lipid panel Not currently on a statin Recheck FLP and CMP Discussed diet and exercise  

## 2022-07-11 NOTE — Progress Notes (Signed)
I,Sulibeya S Dimas,acting as a Education administrator for [name], MD.,have documented all relevant documentation on the behalf of [name], MD,as directed by  [name], MD while in the presence of [name], MD.    Complete physical exam   Patient: [name] [name]   DOB: 1986-06-12   37 y.o. Male  MRN: [id number] Visit Date: 07/11/2022  Today's healthcare provider: [name], MD   Chief Complaint  Patient presents with   Annual Exam   Subjective    [name] [name] is a 37 y.o. male who presents today for a complete physical exam.  He reports consuming a general diet. The patient has a physically strenuous job, but has no regular exercise apart from work.  He generally feels well. He reports sleeping fairly well. He does not have additional problems to discuss today.  HPI   Will schedule a later visit for TDAP vaccination  Pretty sure he has IBS. Mother has it. He has had issues with diarrhea and gas for as long as he can remember. Tries to watch what he eats. Align probiotic and prebiotic helps some.    Past Medical History:  Diagnosis Date   Acid reflux    Allergy    Anxiety    Bicuspid aortic valve 04/2020   Heart murmur    Past Surgical History:  Procedure Laterality Date   LUMBAR DISC SURGERY  11/17/2021   L5-S1   VASECTOMY     Social History   Socioeconomic History   Marital status: Married    Spouse name: Not on file   Number of children: 3   Years of education: Not on file   Highest education level: Not on file  Occupational History    Comment: city of [place]  Tobacco Use   Smoking status: Never   Smokeless tobacco: Current    Types: Chew  Vaping Use   Vaping Use: Never used  Substance and Sexual Activity   Alcohol use: Yes    Alcohol/week: 1.0 - 4.0 standard drink of alcohol    Types: 1 - 4 Cans of beer per week   Drug use: No   Sexual activity: Yes    Partners: Female    Birth control/protection: Surgical  Other  Topics Concern   Not on file  Social History Narrative   Not on file   Social Determinants of Health   Financial Resource Strain: Low Risk  (06/01/2017)   Overall Financial Resource Strain (CARDIA)    Difficulty of Paying Living Expenses: Not hard at all  Food Insecurity: No Food Insecurity (06/01/2017)   Hunger Vital Sign    Worried About Running Out of Food in the Last Year: Never true    [place] in the Last Year: Never true  Transportation Needs: No Transportation Needs (06/01/2017)   PRAPARE - Hydrologist (Medical): No    Lack of Transportation (Non-Medical): No  Physical Activity: Not on file  Stress: Not on file  Social Connections: Not on file  Intimate Partner Violence: Not on file   Family Status  Relation Name Status   Mother  Alive   Father  Alive   Sister  (Not Specified)   PGM  Deceased   PGF  Alive   MGM  Deceased   Neg Hx  (Not Specified)   Family History  Problem Relation Age of Onset   Thyroid disease Mother    Anxiety disorder Mother    Heart  murmur Mother    Diabetes Father        pre-diabetes   Hypothyroidism Sister    Cancer Paternal Grandmother        thinks it was liver and pancreatic   Diabetes Paternal Grandfather    Heart Problems Maternal Grandmother    Prostate cancer Neg Hx    Kidney cancer Neg Hx    Bladder Cancer Neg Hx    Breast cancer Neg Hx    Colon cancer Neg Hx    Allergies  Allergen Reactions   Erythromycin Rash    Patient Care Team: Lillyen Schow, Dionne Bucy, MD as PCP - General (Family Medicine) Wellington Hampshire, MD as PCP - Cardiology (Cardiology)   Medications: Outpatient Medications Prior to Visit  Medication Sig   albuterol (VENTOLIN HFA) 108 (90 Base) MCG/ACT inhaler Inhale 2 puffs into the lungs every 6 (six) hours as needed for wheezing or shortness of breath.   ALPRAZolam (XANAX) 1 MG tablet Take 1 tablet (1 mg total) by mouth daily as needed.   cetirizine (ZYRTEC) 10 MG  tablet Take 10 mg by mouth daily.   fluticasone (FLONASE) 50 MCG/ACT nasal spray Place into both nostrils daily.   fluticasone (FLONASE) 50 MCG/ACT nasal spray Place 2 sprays into both nostrils daily.   ketoconazole (NIZORAL) 2 % cream Apply 1 application topically 2 (two) times daily.   omeprazole (PRILOSEC) 20 MG capsule Take 1 capsule (20 mg total) by mouth daily.   [DISCONTINUED] benzonatate (TESSALON) 100 MG capsule Take 1 capsule (100 mg total) by mouth 3 (three) times daily as needed.   [DISCONTINUED] albuterol (VENTOLIN HFA) 108 (90 Base) MCG/ACT inhaler Inhale 2 puffs into the lungs every 6 (six) hours as needed for wheezing or shortness of breath. (Patient not taking: Reported on 07/11/2022)   [DISCONTINUED] amoxicillin-clavulanate (AUGMENTIN) 875-125 MG tablet Take 1 tablet by mouth 2 (two) times daily. (Patient not taking: Reported on 07/11/2022)   [DISCONTINUED] ipratropium (ATROVENT) 0.03 % nasal spray Place 2 sprays into both nostrils every 12 (twelve) hours. (Patient not taking: Reported on 07/11/2022)   [DISCONTINUED] nirmatrelvir & ritonavir (PAXLOVID, 300/100,) 20 x 150 MG & 10 x 100MG  TBPK Take 2 tablets of nirmatrelvir and 1 tablet of ritonavir by mouth twice daily. (Patient not taking: Reported on 07/11/2022)   No facility-administered medications prior to visit.    Review of Systems  HENT:  Positive for sinus pressure.   Gastrointestinal:  Positive for diarrhea and nausea.  Allergic/Immunologic: Positive for environmental allergies.  All other systems reviewed and are negative.   Last CBC Lab Results  Component Value Date   WBC 9.2 08/25/2020   HGB 14.7 08/25/2020   HCT 42.3 08/25/2020   MCV 87.0 08/25/2020   MCH 30.2 08/25/2020   RDW 12.2 08/25/2020   PLT 162 [id number]   Last metabolic panel Lab Results  Component Value Date   GLUCOSE 85 02/18/2021   NA 140 02/18/2021   K 4.1 02/18/2021   CL 102 02/18/2021   CO2 22 02/18/2021   BUN 13 02/18/2021   CREATININE  1.09 02/18/2021   EGFR 92 02/18/2021   CALCIUM 9.9 02/18/2021   PROT 7.2 02/18/2021   ALBUMIN 5.0 02/18/2021   LABGLOB 2.2 02/18/2021   AGRATIO 2.3 (H) 02/18/2021   BILITOT 0.4 02/18/2021   ALKPHOS 77 02/18/2021   AST 24 02/18/2021   ALT 25 02/18/2021   ANIONGAP 8 08/25/2020   Last lipids Lab Results  Component Value Date   CHOL 252 (H)  02/18/2021   HDL 44 02/18/2021   LDLCALC 126 (H) 02/18/2021   TRIG 461 (H) 02/18/2021   CHOLHDL 5.7 (H) 02/18/2021   Last thyroid functions Lab Results  Component Value Date   TSH 1.590 08/14/2020   Last vitamin D Lab Results  Component Value Date   VD25OH 23.0 (L) 08/14/2020   Last vitamin B12 and Folate Lab Results  Component Value Date   VITAMINB12 499 08/14/2020      Objective    BP 122/86 (BP Location: Right Arm, Patient Position: Sitting, Cuff Size: Large)   Pulse 98   Temp 98.4 F (36.9 C) (Oral)   Resp 16   Ht 5\' 10"  (1.778 m)   Wt 206 lb 3.2 oz (93.5 kg)   SpO2 99%   BMI 29.59 kg/m  BP Readings from Last 3 Encounters:  07/11/22 122/86  12/22/21 130/82  10/28/21 (!) 151/99   Wt Readings from Last 3 Encounters:  07/11/22 206 lb 3.2 oz (93.5 kg)  12/22/21 191 lb 12.8 oz (87 kg)  10/28/21 204 lb 12.9 oz (92.9 kg)      Physical Exam Vitals reviewed.  Constitutional:      General: He is not in acute distress.    Appearance: Normal appearance. He is well-developed. He is not diaphoretic.  HENT:     Head: Normocephalic and atraumatic.     Right Ear: Tympanic membrane, ear canal and external ear normal.     Left Ear: Tympanic membrane, ear canal and external ear normal.     Nose: Nose normal.     Mouth/Throat:     Mouth: Mucous membranes are moist.     Pharynx: Oropharynx is clear. No oropharyngeal exudate.  Eyes:     General: No scleral icterus.    Conjunctiva/sclera: Conjunctivae normal.     Pupils: Pupils are equal, round, and reactive to light.  Neck:     Thyroid: No thyromegaly.  Cardiovascular:      Rate and Rhythm: Normal rate and regular rhythm.     Pulses: Normal pulses.     Heart sounds: Normal heart sounds. No murmur heard. Pulmonary:     Effort: Pulmonary effort is normal. No respiratory distress.     Breath sounds: Normal breath sounds. No wheezing or rales.  Abdominal:     General: There is no distension.     Palpations: Abdomen is soft.     Tenderness: There is no abdominal tenderness.  Musculoskeletal:        General: No deformity.     Cervical back: Neck supple.     Right lower leg: No edema.     Left lower leg: No edema.  Lymphadenopathy:     Cervical: No cervical adenopathy.  Skin:    General: Skin is warm and dry.     Findings: No rash.  Neurological:     Mental Status: He is alert and oriented to person, place, and time. Mental status is at baseline.     Sensory: No sensory deficit.     Motor: No weakness.     Gait: Gait normal.  Psychiatric:        Mood and Affect: Mood normal.        Behavior: Behavior normal.        Thought Content: Thought content normal.       Last depression screening scores    07/11/2022    1:48 PM 02/18/2021    3:22 PM 09/17/2020    3:08 PM  PHQ 2/9  Scores  PHQ - 2 Score 0 0 1  PHQ- 9 Score 2 2 3    Last fall risk screening    07/11/2022    1:47 PM  Fall Risk   Falls in the past year? 0  Number falls in past yr: 0  Injury with Fall? 0  Risk for fall due to : No Fall Risks  Follow up Falls evaluation completed   Last Audit-C alcohol use screening    07/11/2022    1:48 PM  Alcohol Use Disorder Test (AUDIT)  1. How often do you have a drink containing alcohol? 2  2. How many drinks containing alcohol do you have on a typical day when you are drinking? 0  3. How often do you have six or more drinks on one occasion? 0  AUDIT-C Score 2   A score of 3 or more in women, and 4 or more in men indicates increased risk for alcohol abuse, EXCEPT if all of the points are from question 1   No results found for any visits on  07/11/22.  Assessment & Plan    Routine Health Maintenance and Physical Exam  Exercise Activities and Dietary recommendations  Goals   None     Immunization History  Administered Date(s) Administered   DTaP 02/01/1988, 04/08/1988, 06/30/1988, 10/13/1989, 09/25/1992   Hepatitis B 04/12/1999, 05/17/1999, 10/11/1999   IPV 02/01/1988, 04/08/1988, 06/30/1988, 10/13/1989, 09/25/1992   MMR 03/10/1989, 09/25/1992   Tdap 10/28/2010    Health Maintenance  Topic Date Due   DTaP/Tdap/Td (7 - Td or Tdap) 10/27/2020   INFLUENZA VACCINE  10/02/2022 (Originally 02/01/2022)   Hepatitis C Screening  Completed   HIV Screening  Completed   HPV VACCINES  Aged Out    Discussed health benefits of physical activity, and encouraged him to engage in regular exercise appropriate for his age and condition.  Problem List Items Addressed This Visit       Digestive   Fatty liver    Continue to monitor LFTs      Relevant Orders   Comprehensive metabolic panel   Irritable bowel syndrome with diarrhea    Longstanding issue No red flags Ok to use imodium prn Recommend low fod map diet        Other   Vitamin D deficiency    Recommend supplement Recheck level       Relevant Orders   VITAMIN D 25 Hydroxy (Vit-D Deficiency, Fractures)   Overweight    Discussed importance of healthy weight management Discussed diet and exercise       Hyperglycemia    Recommend low carb diet Recheck A1c       Relevant Orders   Comprehensive metabolic panel   Hemoglobin A1c   Moderate mixed hyperlipidemia not requiring statin therapy    Reviewed last lipid panel Not currently on a statin Recheck FLP and CMP Discussed diet and exercise       Relevant Orders   Comprehensive metabolic panel   Lipid panel   Other Visit Diagnoses     Encounter for annual physical exam    -  Primary   Relevant Orders   Comprehensive metabolic panel   Lipid panel   VITAMIN D 25 Hydroxy (Vit-D Deficiency,  Fractures)   Hemoglobin A1c        Return in about 1 year (around 07/12/2023) for CPE.     I, 09/09/2023, MD, have reviewed all documentation for this visit. The documentation on 07/11/22 for the exam, diagnosis,  procedures, and orders are all accurate and complete.   Sharonne Ricketts, Dionne Bucy, MD, MPH [facility]

## 2022-07-11 NOTE — Assessment & Plan Note (Signed)
Discussed importance of healthy weight management Discussed diet and exercise  

## 2022-07-11 NOTE — Assessment & Plan Note (Signed)
Recommend supplement Recheck level

## 2022-09-09 ENCOUNTER — Other Ambulatory Visit: Payer: Self-pay | Admitting: Family Medicine

## 2022-09-09 DIAGNOSIS — F419 Anxiety disorder, unspecified: Secondary | ICD-10-CM

## 2023-01-17 ENCOUNTER — Other Ambulatory Visit: Payer: Self-pay | Admitting: Family Medicine

## 2023-01-17 DIAGNOSIS — F419 Anxiety disorder, unspecified: Secondary | ICD-10-CM

## 2023-04-14 ENCOUNTER — Other Ambulatory Visit: Payer: Self-pay | Admitting: Family Medicine

## 2023-04-14 DIAGNOSIS — K219 Gastro-esophageal reflux disease without esophagitis: Secondary | ICD-10-CM

## 2023-04-14 NOTE — Telephone Encounter (Signed)
Requested Prescriptions  Pending Prescriptions Disp Refills   omeprazole (PRILOSEC) 20 MG capsule [Pharmacy Med Name: OMEPRAZOLE 20MG  CAPSULES] 90 capsule 1    Sig: TAKE 1 CAPSULE(20 MG) BY MOUTH DAILY     Gastroenterology: Proton Pump Inhibitors Passed - 04/14/2023  3:27 AM      Passed - Valid encounter within last 12 months    Recent Outpatient Visits           9 months ago Encounter for annual physical exam   [facility] [place], Marzella Schlein, MD   1 year ago Pharyngitis, unspecified etiology   [facility] Alfredia Ferguson, PA-C   1 year ago No-show for appointment   [facility] Malva Limes, MD   2 years ago Encounter for annual physical exam   [facility] [place], Marzella Schlein, MD   2 years ago Anxiety   [facility] [place], Marzella Schlein, MD

## 2023-06-06 ENCOUNTER — Encounter: Payer: Self-pay | Admitting: Family Medicine

## 2023-06-06 ENCOUNTER — Ambulatory Visit (INDEPENDENT_AMBULATORY_CARE_PROVIDER_SITE_OTHER): Payer: 59 | Admitting: Family Medicine

## 2023-06-06 ENCOUNTER — Ambulatory Visit: Payer: Self-pay

## 2023-06-06 VITALS — BP 136/84 | HR 105 | Temp 97.7°F | Ht 70.0 in | Wt 213.0 lb

## 2023-06-06 DIAGNOSIS — J020 Streptococcal pharyngitis: Secondary | ICD-10-CM | POA: Insufficient documentation

## 2023-06-06 DIAGNOSIS — J358 Other chronic diseases of tonsils and adenoids: Secondary | ICD-10-CM | POA: Insufficient documentation

## 2023-06-06 LAB — POCT RAPID STREP A (OFFICE): Rapid Strep A Screen: NEGATIVE

## 2023-06-06 MED ORDER — AMOXICILLIN 500 MG PO CAPS
500.0000 mg | ORAL_CAPSULE | Freq: Two times a day (BID) | ORAL | 0 refills | Status: AC
Start: 2023-06-06 — End: 2023-06-16

## 2023-06-06 NOTE — Telephone Encounter (Signed)
  Chief Complaint: Short of breath - chest heaviness Symptoms: above Frequency: Over the weekend Pertinent Negatives: Patient denies fever Disposition: [] ED /[] Urgent Care (no appt availability in office) / [x] Appointment(In office/virtual)/ []  [facility]/ [] Home Care/ [] Refused Recommended Disposition /[] [facility]/ []  Follow-up with PCP Additional Notes: Pt states that difficulty taking a deep breath and chest heaviness started over the weekend. Pt did describe heaviness as constant and on left side. Advised ED with chest issue. Pt would like to be seen in office this afternoon. Pt will go to ED if needed.  Reason for Disposition  [1] MILD difficulty breathing (e.g., minimal/no SOB at rest, SOB with walking, pulse <100) AND [2] NEW-onset or WORSE than normal  Answer Assessment - Initial Assessment Questions 1. RESPIRATORY STATUS: "Describe your breathing?" (e.g., wheezing, shortness of breath, unable to speak, severe coughing)      Chest heaviness 2. ONSET: "When did this breathing problem begin?"      Over the weekend 3. PATTERN "Does the difficult breathing come and go, or has it been constant since it started?"      Comes and goes 4. SEVERITY: "How bad is your breathing?" (e.g., mild, moderate, severe)    - MILD: No SOB at rest, mild SOB with walking, speaks normally in sentences, can lie down, no retractions, pulse < 100.    - MODERATE: SOB at rest, SOB with minimal exertion and prefers to sit, cannot lie down flat, speaks in phrases, mild retractions, audible wheezing, pulse 100-120.    - SEVERE: Very SOB at rest, speaks in single words, struggling to breathe, sitting hunched forward, retractions, pulse > 120      mild 5. RECURRENT SYMPTOM: "Have you had difficulty breathing before?" If Yes, ask: "When was the last time?" and "What happened that time?"      no 6. CARDIAC HISTORY: "Do you have any history of heart disease?" (e.g., heart attack, angina, bypass  surgery, angioplasty)      Bicuspid valve 7. LUNG HISTORY: "Do you have any history of lung disease?"  (e.g., pulmonary embolus, asthma, emphysema)     no 8. CAUSE: "What do you think is causing the breathing problem?"      Unsure 9. OTHER SYMPTOMS: "Do you have any other symptoms? (e.g., dizziness, runny nose, cough, chest pain, fever)     Deep breath is difficulty. Green sputum 10. O2 SATURATION MONITOR:  "Do you use an oxygen saturation monitor (pulse oximeter) at home?" If Yes, ask: "What is your reading (oxygen level) today?" "What is your usual oxygen saturation reading?" (e.g., 95%)       95-97  Protocols used: Breathing Difficulty-A-AH

## 2023-06-06 NOTE — Addendum Note (Signed)
Addended by: Shelly Bombard on: 06/06/2023 02:34 PM   Modules accepted: Orders

## 2023-06-06 NOTE — Patient Instructions (Signed)
Some things that can make you feel better are: - Increased rest - Increasing Fluids - Acetaminophen / ibuprofen as needed for fever/pain.  - Salt water gargling, chloraseptic spray and throat lozenges - OTC pseudoephedrine.  - Mucinex.  - Saline sinus flushes or a neti pot.  - Humidifying the air.  

## 2023-06-06 NOTE — Progress Notes (Signed)
Established patient visit   Patient: [name] [name]   DOB: August 24, 1985   37 y.o. Male  MRN: [id number] Visit Date: 06/06/2023  Today's healthcare provider: Jacky Kindle, FNP  Introduced to nurse practitioner role and practice setting.  All questions answered.  Discussed provider/patient relationship and expectations.  Chief Complaint  Patient presents with   Sore Throat    Sore throat, elevated heart rate,coughing w/ green/yellow mucus, dull ache left side of the chest radiated around the upper back.---4 days   Subjective    Sore Throat    HPI     Sore Throat    Additional comments: Sore throat, elevated heart rate,coughing w/ green/yellow mucus, dull ache left side of the chest radiated around the upper back.---4 days      Last edited by Shelly Bombard, CMA on 06/06/2023  1:51 PM.     Symptoms started Saturday morning after being out Friday night; reports kids were sick 2-3 weeks ago with Fifth disease.  Known hx of bicuspid aortic valve; missed ECHO f/u. Encouraged to return call and schedule appt.  Medications: Outpatient Medications Prior to Visit  Medication Sig   albuterol (VENTOLIN HFA) 108 (90 Base) MCG/ACT inhaler Inhale 2 puffs into the lungs every 6 (six) hours as needed for wheezing or shortness of breath.   ALPRAZolam (XANAX) 1 MG tablet TAKE 1 TABLET(1 MG) BY MOUTH DAILY AS NEEDED   cetirizine (ZYRTEC) 10 MG tablet Take 10 mg by mouth daily.   fluticasone (FLONASE) 50 MCG/ACT nasal spray Place into both nostrils daily.   fluticasone (FLONASE) 50 MCG/ACT nasal spray Place 2 sprays into both nostrils daily.   ketoconazole (NIZORAL) 2 % cream Apply 1 application topically 2 (two) times daily.   omeprazole (PRILOSEC) 20 MG capsule TAKE 1 CAPSULE(20 MG) BY MOUTH DAILY   No facility-administered medications prior to visit.     Objective    BP 136/84   Pulse (!) 105   Temp 97.7 F (36.5 C)   Ht 5\' 10"  (1.778 m)   Wt 213 lb (96.6 kg)   SpO2 100%   BMI  30.56 kg/m   Physical Exam Vitals and nursing note reviewed.  Constitutional:      Appearance: Normal appearance. He is normal weight.  HENT:     Head: Normocephalic and atraumatic.     Right Ear: Tympanic membrane and ear canal normal.     Left Ear: Tympanic membrane and ear canal normal.     Mouth/Throat:     Pharynx: Pharyngeal swelling, oropharyngeal exudate and posterior oropharyngeal erythema present.     Tonsils: Tonsillar exudate present. No tonsillar abscesses. 3+ on the right.  Eyes:     Conjunctiva/sclera: Conjunctivae normal.  Cardiovascular:     Rate and Rhythm: Regular rhythm. Tachycardia present.     Pulses: Normal pulses.     Heart sounds: No murmur heard.    Friction rub present.  Pulmonary:     Effort: Pulmonary effort is normal.     Breath sounds: Normal breath sounds.  Musculoskeletal:        General: Normal range of motion.     Cervical back: Normal range of motion.  Skin:    General: Skin is warm and dry.     Capillary Refill: Capillary refill takes less than 2 seconds.  Neurological:     General: No focal deficit present.     Mental Status: He is alert and oriented to person, place, and time. Mental  status is at baseline.  Psychiatric:        Mood and Affect: Mood normal.        Behavior: Behavior normal.     No results found for any visits on 06/06/23.  Assessment & Plan     Problem List Items Addressed This Visit       Respiratory   Strep pharyngitis - Primary   Relevant Medications   amoxicillin (AMOXIL) 500 MG capsule     Other   Tonsillar exudate   Relevant Medications   amoxicillin (AMOXIL) 500 MG capsule  Recommend presumptive treatment given symptoms and known hx of bicuspid valve. Patient reports no known exposure to strep. Was not vaccinated for Flu or COVID this Fall.   Continue OTC supportive therapies and stay hydrated to assist with slight tachycardia      I, Jacky Kindle, FNP, have reviewed all documentation for this  visit. The documentation on 06/06/23 for the exam, diagnosis, procedures, and orders are all accurate and complete.  Jacky Kindle, FNP  [facility] [phone] (phone) [phone] (fax)  [facility]

## 2023-06-08 LAB — CULTURE, GROUP A STREP: Strep A Culture: NEGATIVE

## 2023-09-18 ENCOUNTER — Other Ambulatory Visit: Payer: Self-pay | Admitting: Family Medicine

## 2023-09-18 DIAGNOSIS — F419 Anxiety disorder, unspecified: Secondary | ICD-10-CM

## 2023-09-18 NOTE — Telephone Encounter (Signed)
 Requested medication (s) are due for refill today - unsure  Requested medication (s) are on the active medication list -yes  Future visit scheduled -no  Last refill: 01/17/23 #15  Notes to clinic: non delegated Rx  Requested Prescriptions  Pending Prescriptions Disp Refills   ALPRAZolam (XANAX) 1 MG tablet [Pharmacy Med Name: ALPRAZOLAM 1 MG TABLET] 15 tablet     Sig: TAKE 1 TABLET BY MOUTH DAILY AS NEEDED.     Not Delegated - Psychiatry: Anxiolytics/Hypnotics 2 Failed - 09/18/2023 10:07 AM      Failed - This refill cannot be delegated      Failed - Urine Drug Screen completed in last 360 days      Passed - Patient is not pregnant      Passed - Valid encounter within last 6 months    Recent Outpatient Visits           3 months ago Strep pharyngitis   [facility] Merita Norton T, FNP   1 year ago Encounter for annual physical exam   [facility] Beryle Flock, Marzella Schlein, MD   1 year ago Pharyngitis, unspecified etiology   [facility] Alfredia Ferguson, PA-C   2 years ago No-show for appointment   [facility] Malva Limes, MD   2 years ago Encounter for annual physical exam   [facility] Beryle Flock, Marzella Schlein, MD                 Requested Prescriptions  Pending Prescriptions Disp Refills   ALPRAZolam Prudy Feeler) 1 MG tablet [Pharmacy Med Name: ALPRAZOLAM 1 MG TABLET] 15 tablet     Sig: TAKE 1 TABLET BY MOUTH DAILY AS NEEDED.     Not Delegated - Psychiatry: Anxiolytics/Hypnotics 2 Failed - 09/18/2023 10:07 AM      Failed - This refill cannot be delegated      Failed - Urine Drug Screen completed in last 360 days      Passed - Patient is not pregnant      Passed - Valid encounter within last 6 months    Recent Outpatient Visits           3 months ago Strep pharyngitis   [facility] Jacky Kindle, FNP   1  year ago Encounter for annual physical exam   [facility] Beryle Flock, Marzella Schlein, MD   1 year ago Pharyngitis, unspecified etiology   [facility] Alfredia Ferguson, PA-C   2 years ago No-show for appointment   [facility] Malva Limes, MD   2 years ago Encounter for annual physical exam   [facility] [place], Marzella Schlein, MD

## 2023-09-18 NOTE — Telephone Encounter (Signed)
 LOV 1*8*24 NOV none LRF [id number] LABS 1*8*24

## 2023-09-19 NOTE — Telephone Encounter (Signed)
 Mychart message sent stating he needed appt. before refills could be given.

## 2023-09-19 NOTE — Telephone Encounter (Signed)
 Can someone please reach out to offer this pt an appt, per Dr Roxan Hockey he needs to be seen before any medication can be refilled

## 2023-10-13 ENCOUNTER — Other Ambulatory Visit: Payer: Self-pay | Admitting: Family Medicine

## 2023-10-13 DIAGNOSIS — K219 Gastro-esophageal reflux disease without esophagitis: Secondary | ICD-10-CM

## 2023-10-13 NOTE — Telephone Encounter (Signed)
 OV needed for additional refills.  Requested Prescriptions  Pending Prescriptions Disp Refills   omeprazole (PRILOSEC) 20 MG capsule [Pharmacy Med Name: OMEPRAZOLE DR 20 MG CAPSULE] 30 capsule 0    Sig: TAKE 1 CAPSULE BY MOUTH EVERY DAY     Gastroenterology: Proton Pump Inhibitors Failed - 10/13/2023  1:35 PM      Failed - Valid encounter within last 12 months    Recent Outpatient Visits           6 years ago Acute non-recurrent maxillary sinusitis   [facility], Eilleen Kempf, MD

## 2023-10-19 NOTE — Telephone Encounter (Unsigned)
 Copied from CRM [id number]. Topic: Clinical - Medication Refill >> Oct 19, 2023  2:42 PM Tiffany S wrote: Most Recent Primary Care Visit:  Provider: PAYNE, ELISE T  Department: [facility]  Visit Type: OFFICE VISIT  Date: 06/06/2023  Medication: omeprazole (PRILOSEC) 20 MG capsule [[id number]]  ALPRAZolam (XANAX) 1 MG tablet [[id number]]  Has the patient contacted their pharmacy? Yes (Agent: If no, request that the patient contact the pharmacy for the refill. If patient does not wish to contact the pharmacy document the reason why and proceed with request.) (Agent: If yes, when and what did the pharmacy advise?)  Is this the correct pharmacy for this prescription? Yes If no, delete pharmacy and type the correct one.  This is the patient's preferred pharmacy:    [id number] - [address] [place] [address] Phone: [phone] Fax: [phone]   Has the prescription been filled recently? Yes  Is the patient out of the medication? Yes  Has the patient been seen for an appointment in the last year OR does the patient have an upcoming appointment? Yes  Can we respond through MyChart? Yes  Agent: Please be advised that Rx refills may take up to 3 business days. We ask that you follow-up with your pharmacy.

## 2023-12-07 ENCOUNTER — Telehealth: Payer: Self-pay

## 2023-12-07 ENCOUNTER — Ambulatory Visit (INDEPENDENT_AMBULATORY_CARE_PROVIDER_SITE_OTHER): Payer: Self-pay | Admitting: Physician Assistant

## 2023-12-07 VITALS — BP 122/65 | HR 60 | Resp 16 | Ht 71.0 in | Wt 189.0 lb

## 2023-12-07 DIAGNOSIS — F419 Anxiety disorder, unspecified: Secondary | ICD-10-CM

## 2023-12-07 DIAGNOSIS — F410 Panic disorder [episodic paroxysmal anxiety] without agoraphobia: Secondary | ICD-10-CM | POA: Diagnosis not present

## 2023-12-07 DIAGNOSIS — K219 Gastro-esophageal reflux disease without esophagitis: Secondary | ICD-10-CM

## 2023-12-07 MED ORDER — OMEPRAZOLE 20 MG PO CPDR: 20.0000 mg | DELAYED_RELEASE_CAPSULE | Freq: Every day | ORAL | 0 refills | Status: DC

## 2023-12-07 MED ORDER — ALPRAZOLAM 1 MG PO TABS
1.0000 mg | ORAL_TABLET | Freq: Every day | ORAL | 0 refills | Status: DC | PRN
Start: 2023-12-07 — End: 2024-03-20

## 2023-12-07 MED ORDER — ALPRAZOLAM 1 MG PO TABS
1.0000 mg | ORAL_TABLET | Freq: Every day | ORAL | 0 refills | Status: DC | PRN
Start: 2023-12-07 — End: 2023-12-07

## 2023-12-07 MED ORDER — OMEPRAZOLE 20 MG PO CPDR: 20.0000 mg | DELAYED_RELEASE_CAPSULE | Freq: Every day | ORAL | 0 refills | Status: AC

## 2023-12-07 NOTE — Telephone Encounter (Signed)
 FYI Only or Action Required?: Action required by provider  Patient was last seen in primary care on 06/06/2023 by Normie Becton, FNP. Called Nurse to request update on refills of Xanax  and Omeprazole . Please see request below.    Copied from CRM [id number]. Topic: Clinical - Medication Refill >> Oct 19, 2023  2:42 PM Tiffany S wrote: Most Recent Primary Care Visit:  Provider: PAYNE, ELISE T  Department: [facility]  Visit Type: OFFICE VISIT  Date: 06/06/2023  Medication: omeprazole  (PRILOSEC) 20 MG capsule [[id number]]  ALPRAZolam  (XANAX ) 1 MG tablet [[id number]]  Has the patient contacted their pharmacy? Yes (Agent: If no, request that the patient contact the pharmacy for the refill. If patient does not wish to contact the pharmacy document the reason why and proceed with request.) (Agent: If yes, when and what did the pharmacy advise?)  Is this the correct pharmacy for this prescription? Yes If no, delete pharmacy and type the correct one.  This is the patient's preferred pharmacy:    [id number] - [address] [place] [address] Phone: [phone] Fax: [phone]   Has the prescription been filled recently? Yes  Is the patient out of the medication? Yes  Has the patient been seen for an appointment in the last year OR does the patient have an upcoming appointment? Yes  Can we respond through MyChart? Yes  Agent: Please be advised that Rx refills may take up to 3 business days. We ask that you follow-up with your pharmacy. >> Dec 07, 2023 11:34 AM [place] J wrote: Patient is following up for a refill on both medications. He was wondering if he could have enough of omeprazole  and alprazolam  until his appointment for July 3rd. He has been trying to book an appointment with his PCP for a while now but appointments were all far out.   Please call or reach out via MyChart if this is something we can do.

## 2023-12-08 ENCOUNTER — Encounter: Payer: Self-pay | Admitting: Physician Assistant

## 2023-12-08 NOTE — Progress Notes (Signed)
 Established patient visit  Patient: [name] [name]   DOB: May 09, 1986   39 y.o. Male  MRN: [id number] Visit Date: 12/07/2023  Today's healthcare provider: Blane Bunting, PA-C   Chief Complaint  Patient presents with   Medication Refill    Med Refill   Subjective     HPI     Medication Refill    Additional comments: Med Refill      Last edited by Estill Hemming, CMA on 12/07/2023  1:35 PM.       Discussed the use of AI scribe software for clinical note transcription with the patient, who gave verbal consent to proceed.  History of Present Illness [name] [name] "[name]" is a 38 year old male who presents for medication refills.  He has been without his medications for two months due to difficulty scheduling an appointment. He takes medication for acid reflux, which was diagnosed in 2018. Without it, he experiences daily throat burning, especially at night, but has no swallowing difficulties. He follows lifestyle modifications to manage his symptoms.  He uses alprazolam  for anxiety and panic attacks, typically obtaining 15 tablets once or twice a year. He ran out of this medication two months ago and has been unable to refill it. He uses it only during episodes of anxiety or panic attacks.  His inhaler is expired, although he has not needed to use it recently. He has lost weight, dropping from 215 pounds to 189 pounds, attributed to starting work. He has no chest pain, shortness of breath, or painful bowel movements.       07/11/2022    1:48 PM 02/18/2021    3:22 PM 09/17/2020    3:08 PM  Depression screen PHQ 2/9  Decreased Interest 0 0 1  Down, Depressed, Hopeless 0 0 0  PHQ - 2 Score 0 0 1  Altered sleeping 1 1 1   Tired, decreased energy 1 1 1   Change in appetite 0 0 0  Feeling bad or failure about yourself  0 0 0  Trouble concentrating 0 0 0  Moving slowly or fidgety/restless 0 0 0  Suicidal thoughts 0 0 0  PHQ-9 Score 2 2 3   Difficult doing work/chores Not  difficult at all Not difficult at all Not difficult at all      07/17/2020   10:34 AM 06/08/2018   11:16 AM 09/18/2017    4:23 PM 08/18/2017    4:03 PM  GAD 7 : Generalized Anxiety Score  Nervous, Anxious, on Edge 1 1 1 3   Control/stop worrying 0 1 1 2   Worry too much - different things 1 1 1 2   Trouble relaxing 1 1 0 1  Restless 0 1 0 1  Easily annoyed or irritable 0 1 1 1   Afraid - awful might happen 0 0 1 1  Total GAD 7 Score 3 6 5 11   Anxiety Difficulty Not difficult at all Not difficult at all  Not difficult at all    Medications: Outpatient Medications Prior to Visit  Medication Sig   albuterol  (VENTOLIN  HFA) 108 (90 Base) MCG/ACT inhaler Inhale 2 puffs into the lungs every 6 (six) hours as needed for wheezing or shortness of breath.   cetirizine  (ZYRTEC ) 10 MG tablet Take 10 mg by mouth daily.   fluticasone  (FLONASE ) 50 MCG/ACT nasal spray Place into both nostrils daily.   fluticasone  (FLONASE ) 50 MCG/ACT nasal spray Place 2 sprays into both nostrils daily.   ketoconazole  (NIZORAL ) 2 % cream Apply 1 application  topically 2 (two) times daily.   [DISCONTINUED] ALPRAZolam  (XANAX ) 1 MG tablet TAKE 1 TABLET(1 MG) BY MOUTH DAILY AS NEEDED   [DISCONTINUED] omeprazole  (PRILOSEC) 20 MG capsule TAKE 1 CAPSULE BY MOUTH EVERY DAY   No facility-administered medications prior to visit.    Review of Systems All negative Except see HPI       Objective    BP 122/65 (BP Location: Right Arm, Cuff Size: Normal)   Pulse 60   Resp 16   Ht 5\' 11"  (1.803 m)   Wt 189 lb (85.7 kg)   SpO2 100%   BMI 26.36 kg/m     Physical Exam Vitals reviewed.  Constitutional:      General: He is not in acute distress.    Appearance: Normal appearance. He is not diaphoretic.  HENT:     Head: Normocephalic and atraumatic.  Eyes:     General: No scleral icterus.    Conjunctiva/sclera: Conjunctivae normal.  Cardiovascular:     Rate and Rhythm: Normal rate and regular rhythm.     Pulses: Normal  pulses.     Heart sounds: Normal heart sounds. No murmur heard. Pulmonary:     Effort: Pulmonary effort is normal. No respiratory distress.     Breath sounds: Normal breath sounds. No wheezing or rhonchi.  Musculoskeletal:     Cervical back: Neck supple.     Right lower leg: No edema.     Left lower leg: No edema.  Lymphadenopathy:     Cervical: No cervical adenopathy.  Skin:    General: Skin is warm and dry.     Findings: No rash.  Neurological:     Mental Status: He is alert and oriented to person, place, and time. Mental status is at baseline.  Psychiatric:        Mood and Affect: Mood normal.        Behavior: Behavior normal.      No results found for any visits on 12/07/23.      Assessment & Plan Gastroesophageal Reflux Disease (GERD) Chronic GERD controlled with lifestyle modifications and medication. Discussed risk of precancerous changes if unmanaged. - Prescribe 20 mg daily medication for 90 days. - Advise to avoid trigger foods such as tomatoes, oranges, coffee, and chocolate. - Recommend  Elevate the head of the bed 6-8 inches, avoid recumbency for 3 hours after eating, avoid trigger food, encouraged weight loss   - Instruct to return if symptoms worsen for potential H. pylori testing or endoscopy.  Anxiety with Panic Attacks Anxiety with panic attacks managed with alprazolam  as needed. Therapy recommended for long-term management. - Prescribe alprazolam  as needed, 15 tablets. - Discuss therapy as a potential long-term treatment option for anxiety.   General Health Maintenance Missed annual physical in 2024. Weight loss from 215 lbs to 189 lbs noted. - Encourage continued weight management and healthy lifestyle.  Follow-up Follow-up appointment scheduled with Doctor B in July 2025. - Advise to call weekly for potential cancellations to see Doctor B sooner.  No orders of the defined types were placed in this encounter.   No follow-ups on file.   The  patient was advised to call back or seek an in-person evaluation if the symptoms worsen or if the condition fails to improve as anticipated.  I discussed the assessment and treatment plan with the patient. The patient was provided an opportunity to ask questions and all were answered. The patient agreed with the plan and demonstrated an understanding of the instructions.  I, Omarion Minnehan, PA-C have reviewed all documentation for this visit. The documentation on 12/07/2023  for the exam, diagnosis, procedures, and orders are all accurate and complete.  Blane Bunting, [facility], MMS [facility] [phone] (phone) [phone] (fax)  [facility]

## 2024-01-04 ENCOUNTER — Encounter: Payer: Self-pay | Admitting: Family Medicine

## 2024-01-04 ENCOUNTER — Telehealth: Payer: Self-pay

## 2024-01-04 NOTE — Progress Notes (Signed)
 Erroneous - disregard  Patient unable to connect to video visit and wishes to reschedule for CPE in person within next 3 months

## 2024-01-08 ENCOUNTER — Other Ambulatory Visit (HOSPITAL_COMMUNITY): Payer: Self-pay

## 2024-01-08 ENCOUNTER — Telehealth: Payer: Self-pay

## 2024-01-08 NOTE — Telephone Encounter (Signed)
 PA request has been Approved. New Encounter has been or will be created for follow up. For additional info see Pharmacy Prior Auth telephone encounter from 01/08/2024.

## 2024-01-08 NOTE — Telephone Encounter (Signed)
 Pharmacy Patient Advocate Encounter   Received notification from Pt Calls Messages that prior authorization for Omeprazole  20MG  dr capsules is required/requested.   Insurance verification completed.   The patient is insured through [facility] .   Per test claim: PA required and submitted KEY/EOC/Request #: [id number] from 01/08/2024 to 01/07/2025

## 2024-01-16 ENCOUNTER — Emergency Department: Payer: Self-pay

## 2024-01-16 ENCOUNTER — Other Ambulatory Visit: Payer: Self-pay

## 2024-01-16 ENCOUNTER — Telehealth: Payer: Self-pay | Admitting: Cardiovascular Disease

## 2024-01-16 ENCOUNTER — Emergency Department
Admission: EM | Admit: 2024-01-16 | Discharge: 2024-01-16 | Disposition: A | Discharge status: Home / Self Care | Payer: Self-pay

## 2024-01-16 DIAGNOSIS — M549 Dorsalgia, unspecified: Secondary | ICD-10-CM | POA: Insufficient documentation

## 2024-01-16 DIAGNOSIS — R0789 Other chest pain: Secondary | ICD-10-CM | POA: Insufficient documentation

## 2024-01-16 DIAGNOSIS — Q2381 Bicuspid aortic valve: Secondary | ICD-10-CM

## 2024-01-16 LAB — TROPONIN I (HIGH SENSITIVITY): Troponin I (High Sensitivity): 3 ng/L (ref <18)

## 2024-01-16 LAB — BASIC METABOLIC PANEL WITH GFR
Anion gap: 12 (ref 5–15)
BUN: 21 mg/dL — ABNORMAL HIGH (ref 6–20)
CO2: 22 mmol/L (ref 22–32)
Calcium: 9.7 mg/dL (ref 8.9–10.3)
Chloride: 107 mmol/L (ref 98–111)
Creatinine, Ser: 1.16 mg/dL (ref 0.61–1.24)
GFR, Estimated: >60 mL/min (ref >60)
Glucose, Bld: 89 mg/dL (ref 70–99)
Potassium: 4 mmol/L (ref 3.5–5.1)
Sodium: 141 mmol/L (ref 135–145)

## 2024-01-16 LAB — CBC
HCT: 44.1 % (ref 39.0–52.0)
Hemoglobin: 14.7 g/dL (ref 13.0–17.0)
MCH: 29.2 pg (ref 26.0–34.0)
MCHC: 33.3 g/dL (ref 30.0–36.0)
MCV: 87.7 fL (ref 80.0–100.0)
Platelets: 231 K/uL (ref 150–400)
RBC: 5.03 MIL/uL (ref 4.22–5.81)
RDW: 12.2 % (ref 11.5–15.5)
WBC: 8 K/uL (ref 4.0–10.5)
nRBC: 0 % (ref 0.0–0.2)

## 2024-01-16 LAB — D-DIMER, QUANTITATIVE: D-Dimer, Quant: <0.27 ug{FEU}/mL (ref 0.00–0.50)

## 2024-01-16 NOTE — ED Notes (Signed)
 Called CCMD at this time.

## 2024-01-16 NOTE — ED Provider Notes (Signed)
 [facility] Provider Note    Event Date/Time   First MD Initiated Contact with Patient 01/16/24 1653     (approximate)   History   Chest Pain   HPI  [name] [name] is a 38 y.o. male with history of GERD, anxiety, migraine, seizure-like activity and as listed in EMR presents to the emergency department for treatment and evaluation of left-sided, intermittent chest pain for 3 weeks.  Pain radiates into the left arm.  Also has some mid back pain, but doesn't feel that is related.  He is currently in the care of of cardiology for tachycardia, but not currently on medications. He denies palpitations or shortness of breath. Pain is not induced by deep breath or movement. He does work outside, but does not recall muscle strain or injury..     Physical Exam    Vitals:   01/16/24 1710 01/16/24 1810  BP: 123/85   Pulse: 83 75  Resp: 13 19  Temp:    SpO2: 99% 98%    General: Awake, no distress.  CV:  Good peripheral perfusion. Regular rate.  No lower extremity edema. Resp:  Normal effort. Breath sounds clear to auscultation. Abd:  No distention.  Other:     ED Results / Procedures / Treatments   Labs (all labs ordered are listed, but only abnormal results are displayed)  Labs Reviewed  BASIC METABOLIC PANEL WITH GFR - Abnormal; Notable for the following components:      Result Value   BUN 21 (*)    All other components within normal limits  CBC  D-DIMER, QUANTITATIVE  TROPONIN I (HIGH SENSITIVITY)     EKG  Normal sinus rhythm with a rate of 90   RADIOLOGY  Image and radiology report reviewed and interpreted by me. Radiology report consistent with the same.  No acute cardiopulmonary abnormality.  PROCEDURES:  Critical Care performed: No  Procedures   MEDICATIONS ORDERED IN ED:  Medications - No data to display   IMPRESSION / MDM / ASSESSMENT AND PLAN / ED COURSE   I have reviewed the triage note and vital signs. Vital signs  reassuring.   Differential diagnosis includes, but is not limited to, angina, STEMI, NSTEMI, PE, musculoskeletal strain, anxiety  Patient's presentation is most consistent with acute presentation with potential threat to life or bodily function.  The patient is on the cardiac monitor to evaluate for evidence of arrhythmia and/or significant heart rate changes.  38 year old male presenting to the emergency department for treatment and evaluation of chest pain that has been intermittent for the past 3 weeks.  See HPI for further details.  Exam and vital signs are reassuring.  Plan will be to send D-dimer due to persistent symptoms.  Lab studies including D-dimer are all reassuring.  Chest x-ray is negative for acute findings.  EKG is normal.  Results discussed with the patient who feels reassured.  He feels that this could potentially be anxiety although he has not felt any additional stress as of late.  It could also be muscle spasm.  He was advised to continue taking his anxiety medication as prescribed and take Aleve or ibuprofen for pain.  He is agreeable to this plan.  He has an upcoming appointment with cardiology in August and was encouraged to keep that as scheduled.  ER return precautions were discussed as well.      FINAL CLINICAL IMPRESSION(S) / ED DIAGNOSES   Final diagnoses:  Acute chest wall pain  Rx / DC Orders   ED Discharge Orders     None        Note:  This document was prepared using Dragon voice recognition software and may include unintentional dictation errors.   Herlinda Kirk NOVAK, FNP 01/16/24 1848    Clarine Ozell LABOR, MD 01/17/24 2352

## 2024-01-16 NOTE — Telephone Encounter (Signed)
 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 Deatrice Cage. I advised patient to have someone drive him to ED and have his chest pain and symptoms evaluated.  Pt verbalized understanding and stated he would have someone take him to ED. Pt thanked me for returning his call.

## 2024-01-16 NOTE — ED Triage Notes (Addendum)
 Pt comes with c/o left sided cp for three weeks. Pt state radiation to left arm. Pt states back pain as well. [facility]. Pt some sob at time.  Pt has hx of heart murmur and bicuspid valve issue

## 2024-01-16 NOTE — Telephone Encounter (Signed)
   Pt c/o of Chest Pain: STAT if active CP, including tightness, pressure, jaw pain, radiating pain to shoulder/upper arm/back, CP unrelieved by Nitro. Symptoms reported of SOB, nausea, vomiting, sweating.  1. Are you having CP right now? Yes and over the past 2 weeks, Chest tightness and dull pain under left armpit, left arm pain   2. Are you experiencing any other symptoms (ex. SOB, nausea, vomiting, sweating)? Lightheaded and congestion   3. Is your CP continuous or coming and going? Coming and going   4. Have you taken Nitroglycerin? no   5. How long have you been experiencing CP? Off and on about 2-3 weeks    6. If NO CP at time of call then end call with telling Pt to call back or call 911 if Chest pain returns prior to return call from triage team.

## 2024-01-16 NOTE — ED Notes (Signed)
 No repeat trop needed per PA.

## 2024-01-17 NOTE — Telephone Encounter (Signed)
 He has known history of bicuspid aortic valve and was supposed to get a follow-up echocardiogram.  Please order an echocardiogram on him to be done before his office visit with me.

## 2024-01-18 NOTE — Addendum Note (Signed)
 Addended by: ESTELLE OLAM GRADE on: 01/18/2024 03:56 PM   Modules accepted: Orders

## 2024-01-18 NOTE — Telephone Encounter (Signed)
 Echo ordered and message sent to scheduling.

## 2024-02-05 ENCOUNTER — Other Ambulatory Visit: Payer: Self-pay

## 2024-02-15 ENCOUNTER — Ambulatory Visit: Payer: Self-pay | Admitting: Cardiovascular Disease

## 2024-02-15 NOTE — Progress Notes (Deleted)
 Cardiology Office Note   Date:  02/15/2024   ID:  [name] [name], DOB 05/17/1986, MRN [id number]  PCP:  Myrla Jon HERO, MD  Cardiologist:   Deatrice Cage, MD   No chief complaint on file.     History of Present Illness: [name] [name] is a 38 y.o. male who was referred from [facility] ED for evaluation of chest pain and shortness of breath.  The patient has no prior cardiac history.  He has known history of hyperlipidemia and anxiety disorder.  He is not diabetic and does not smoke.  There is no family history of premature coronary artery disease or sudden death.  The patient exercises regularly and goes to the gym almost daily.  He runs of the treadmill for 1 mile and he does mostly weight lifting.  Recently, he had intermittent episodes of left-sided chest tightness radiating to his back and between his shoulder blades.  These episodes can happen at rest or with exertion.  In addition, he had episodes of intermittent shortness of breath and some tingling in his fingers.  He feels very anxious about the symptoms.  No significant orthopnea, PND or leg edema.  He went to the emergency room at [facility] recently for the symptoms.  Troponin was less than 2.  EKG showed no ischemic changes.  Chest x-ray was unremarkable.    Past Medical History:  Diagnosis Date   Acid reflux    Allergy    Anxiety    Bicuspid aortic valve 04/2020   Heart murmur     Past Surgical History:  Procedure Laterality Date   LUMBAR DISC SURGERY  11/17/2021   L5-S1   VASECTOMY       Current Outpatient Medications  Medication Sig Dispense Refill   albuterol  (VENTOLIN  HFA) 108 (90 Base) MCG/ACT inhaler Inhale 2 puffs into the lungs every 6 (six) hours as needed for wheezing or shortness of breath. 8 g 2   ALPRAZolam  (XANAX ) 1 MG tablet Take 1 tablet (1 mg total) by mouth daily as needed for anxiety. TAKE 1 TABLET(1 MG) BY MOUTH DAILY AS NEEDED 15 tablet 0   cetirizine  (ZYRTEC ) 10 MG tablet Take 10 mg by mouth  daily.     fluticasone  (FLONASE ) 50 MCG/ACT nasal spray Place into both nostrils daily.     fluticasone  (FLONASE ) 50 MCG/ACT nasal spray Place 2 sprays into both nostrils daily. 16 g 6   ketoconazole  (NIZORAL ) 2 % cream Apply 1 application topically 2 (two) times daily. 60 g 1   omeprazole  (PRILOSEC) 20 MG capsule Take 1 capsule (20 mg total) by mouth daily. TAKE 1 CAPSULE BY MOUTH EVERY DAY 90 capsule 0   No current facility-administered medications for this visit.    Allergies:   Erythromycin    Social History:  The patient  reports that he has never smoked. His smokeless tobacco use includes chew. He reports current alcohol use of about 1.0 - 4.0 standard drink of alcohol per week. He reports that he does not use drugs.   Family History:  The patient's family history includes Anxiety disorder in his mother; Cancer in his paternal grandmother; Diabetes in his father and paternal grandfather; Heart Problems in his maternal grandmother; Heart murmur in his mother; Hypothyroidism in his sister; Thyroid  disease in his mother.    ROS:  Please see the history of present illness.   Otherwise, review of systems are positive for none.   All other systems are reviewed and negative.  PHYSICAL EXAM: VS:  There were no vitals taken for this visit. , BMI There is no height or weight on file to calculate BMI. GEN: Well nourished, well developed, in no acute distress  HEENT: normal  Neck: no JVD, carotid bruits, or masses Cardiac: RRR; no murmurs, rubs, or gallops,no edema  Respiratory:  clear to auscultation bilaterally, normal work of breathing GI: soft, nontender, nondistended, + BS MS: no deformity or atrophy  Skin: warm and dry, no rash Neuro:  Strength and sensation are intact Psych: euthymic mood, full affect   EKG:  EKG is ordered today. The ekg ordered today demonstrates normal sinus rhythm with no significant ST or T wave changes.   Recent Labs: 01/16/2024: BUN 21; Creatinine, Ser  1.16; Hemoglobin 14.7; Platelets 231; Potassium 4.0; Sodium 141    Lipid Panel    Component Value Date/Time   CHOL 252 (H) 02/18/2021 1604   TRIG 461 (H) 02/18/2021 1604   HDL 44 02/18/2021 1604   CHOLHDL 5.7 (H) 02/18/2021 1604   LDLCALC 126 (H) 02/18/2021 1604      Wt Readings from Last 3 Encounters:  01/16/24 187 lb (84.8 kg)  12/07/23 189 lb (85.7 kg)  06/06/23 213 lb (96.6 kg)           03/10/2020    1:58 PM  PAD Screen  Previous PAD dx? No  Previous surgical procedure? No  Pain with walking? No  Feet/toe relief with dangling? No  Painful, non-healing ulcers? No  Extremities discolored? No      ASSESSMENT AND PLAN:  1.  Atypical chest pain: The patient risk factors include gender and hyperlipidemia: EKG does not show any ischemic changes.  Recommend evaluation with a treadmill stress test.  2.  Intermittent shortness of breath: I requested an echocardiogram to ensure no cardiomyopathy or valvular abnormalities.  3.  Anxiety: This might be contributing or causing all of his symptoms.  If cardiac work-up is unremarkable, recommend addressing this.    Disposition:   FU with me as needed.  Signed,  Deatrice Cage, MD  02/15/2024 2:40 PM    [facility]

## 2024-03-07 ENCOUNTER — Ambulatory Visit: Payer: Self-pay

## 2024-03-19 ENCOUNTER — Other Ambulatory Visit: Payer: Self-pay | Admitting: Physician Assistant

## 2024-03-19 DIAGNOSIS — F419 Anxiety disorder, unspecified: Secondary | ICD-10-CM

## 2024-03-19 DIAGNOSIS — F410 Panic disorder [episodic paroxysmal anxiety] without agoraphobia: Secondary | ICD-10-CM

## 2024-04-02 ENCOUNTER — Ambulatory Visit: Payer: Self-pay | Attending: Cardiovascular Disease | Admitting: Cardiovascular Disease

## 2024-04-02 NOTE — Progress Notes (Deleted)
 Cardiology Office Note   Date:  04/02/2024   ID:  [name] [name], DOB 06/16/1986, MRN [id number]  PCP:  Myrla Jon HERO, MD  Cardiologist:   Deatrice Cage, MD   No chief complaint on file.     History of Present Illness: [name] [name] is a 38 y.o. male who was referred from [facility] ED for evaluation of chest pain and shortness of breath.  The patient has no prior cardiac history.  He has known history of hyperlipidemia and anxiety disorder.  He is not diabetic and does not smoke.  There is no family history of premature coronary artery disease or sudden death.  The patient exercises regularly and goes to the gym almost daily.  He runs of the treadmill for 1 mile and he does mostly weight lifting.  Recently, he had intermittent episodes of left-sided chest tightness radiating to his back and between his shoulder blades.  These episodes can happen at rest or with exertion.  In addition, he had episodes of intermittent shortness of breath and some tingling in his fingers.  He feels very anxious about the symptoms.  No significant orthopnea, PND or leg edema.  He went to the emergency room at [facility] recently for the symptoms.  Troponin was less than 2.  EKG showed no ischemic changes.  Chest x-ray was unremarkable.    Past Medical History:  Diagnosis Date   Acid reflux    Allergy    Anxiety    Bicuspid aortic valve 04/2020   Heart murmur     Past Surgical History:  Procedure Laterality Date   LUMBAR DISC SURGERY  11/17/2021   L5-S1   VASECTOMY       Current Outpatient Medications  Medication Sig Dispense Refill   albuterol  (VENTOLIN  HFA) 108 (90 Base) MCG/ACT inhaler Inhale 2 puffs into the lungs every 6 (six) hours as needed for wheezing or shortness of breath. 8 g 2   ALPRAZolam  (XANAX ) 1 MG tablet TAKE 1 TABLET (1 MG TOTAL) BY MOUTH DAILY AS NEEDED FOR ANXIETY. TAKE 1 TABLET(1 MG) BY MOUTH DAILY AS NEEDED 15 tablet 0   cetirizine  (ZYRTEC ) 10 MG tablet Take 10 mg by mouth  daily.     fluticasone  (FLONASE ) 50 MCG/ACT nasal spray Place into both nostrils daily.     fluticasone  (FLONASE ) 50 MCG/ACT nasal spray Place 2 sprays into both nostrils daily. 16 g 6   ketoconazole  (NIZORAL ) 2 % cream Apply 1 application topically 2 (two) times daily. 60 g 1   omeprazole  (PRILOSEC) 20 MG capsule Take 1 capsule (20 mg total) by mouth daily. TAKE 1 CAPSULE BY MOUTH EVERY DAY 90 capsule 0   No current facility-administered medications for this visit.    Allergies:   Erythromycin    Social History:  The patient  reports that he has never smoked. His smokeless tobacco use includes chew. He reports current alcohol use of about 1.0 - 4.0 standard drink of alcohol per week. He reports that he does not use drugs.   Family History:  The patient's family history includes Anxiety disorder in his mother; Cancer in his paternal grandmother; Diabetes in his father and paternal grandfather; Heart Problems in his maternal grandmother; Heart murmur in his mother; Hypothyroidism in his sister; Thyroid  disease in his mother.    ROS:  Please see the history of present illness.   Otherwise, review of systems are positive for none.   All other systems are reviewed and negative.  PHYSICAL EXAM: VS:  There were no vitals taken for this visit. , BMI There is no height or weight on file to calculate BMI. GEN: Well nourished, well developed, in no acute distress  HEENT: normal  Neck: no JVD, carotid bruits, or masses Cardiac: RRR; no murmurs, rubs, or gallops,no edema  Respiratory:  clear to auscultation bilaterally, normal work of breathing GI: soft, nontender, nondistended, + BS MS: no deformity or atrophy  Skin: warm and dry, no rash Neuro:  Strength and sensation are intact Psych: euthymic mood, full affect   EKG:  EKG is ordered today. The ekg ordered today demonstrates normal sinus rhythm with no significant ST or T wave changes.   Recent Labs: 01/16/2024: BUN 21; Creatinine, Ser  1.16; Hemoglobin 14.7; Platelets 231; Potassium 4.0; Sodium 141    Lipid Panel    Component Value Date/Time   CHOL 252 (H) 02/18/2021 1604   TRIG 461 (H) 02/18/2021 1604   HDL 44 02/18/2021 1604   CHOLHDL 5.7 (H) 02/18/2021 1604   LDLCALC 126 (H) 02/18/2021 1604      Wt Readings from Last 3 Encounters:  01/16/24 187 lb (84.8 kg)  12/07/23 189 lb (85.7 kg)  06/06/23 213 lb (96.6 kg)           03/10/2020    1:58 PM  PAD Screen  Previous PAD dx? No  Previous surgical procedure? No  Pain with walking? No  Feet/toe relief with dangling? No  Painful, non-healing ulcers? No  Extremities discolored? No      ASSESSMENT AND PLAN:  1.  Atypical chest pain: The patient risk factors include gender and hyperlipidemia: EKG does not show any ischemic changes.  Recommend evaluation with a treadmill stress test.  2.  Intermittent shortness of breath: I requested an echocardiogram to ensure no cardiomyopathy or valvular abnormalities.  3.  Anxiety: This might be contributing or causing all of his symptoms.  If cardiac work-up is unremarkable, recommend addressing this.    Disposition:   FU with me as needed.  Signed,  Deatrice Cage, MD  04/02/2024 1:55 PM    [facility]

## 2024-04-08 ENCOUNTER — Encounter: Payer: Self-pay | Admitting: Family Medicine

## 2024-04-08 ENCOUNTER — Ambulatory Visit (INDEPENDENT_AMBULATORY_CARE_PROVIDER_SITE_OTHER): Payer: Self-pay | Admitting: Family Medicine

## 2024-04-08 VITALS — BP 122/79 | HR 76 | Ht 72.0 in | Wt 198.2 lb

## 2024-04-08 DIAGNOSIS — R0681 Apnea, not elsewhere classified: Secondary | ICD-10-CM

## 2024-04-08 DIAGNOSIS — K591 Functional diarrhea: Secondary | ICD-10-CM

## 2024-04-08 DIAGNOSIS — Z0000 Encounter for general adult medical examination without abnormal findings: Secondary | ICD-10-CM

## 2024-04-08 DIAGNOSIS — F419 Anxiety disorder, unspecified: Secondary | ICD-10-CM

## 2024-04-08 DIAGNOSIS — E782 Mixed hyperlipidemia: Secondary | ICD-10-CM

## 2024-04-08 DIAGNOSIS — E559 Vitamin D deficiency, unspecified: Secondary | ICD-10-CM

## 2024-04-08 DIAGNOSIS — R454 Irritability and anger: Secondary | ICD-10-CM

## 2024-04-08 DIAGNOSIS — F410 Panic disorder [episodic paroxysmal anxiety] without agoraphobia: Secondary | ICD-10-CM

## 2024-04-08 DIAGNOSIS — R5382 Chronic fatigue, unspecified: Secondary | ICD-10-CM

## 2024-04-08 MED ORDER — ALPRAZOLAM 1 MG PO TABS: 1.0000 mg | ORAL_TABLET | Freq: Every day | ORAL | 1 refills | Status: AC | PRN

## 2024-04-08 NOTE — Assessment & Plan Note (Signed)
 Uses Xanax  sparingly, approximately fills twice a year. No immediate need for refill. - Provide Xanax  refill to be available at pharmacy for future use

## 2024-04-08 NOTE — Progress Notes (Signed)
 Complete physical exam   Patient: [name] [name]   DOB: 1985-12-27   38 y.o. Male  MRN: [id number] Visit Date: 04/08/2024  Today's healthcare provider: Jon Eva, MD   Chief Complaint  Patient presents with   Annual Exam    Last completed 07/11/22 Diet -  general well balanced Exercise - 5 days a week for one hour minimum Feeling - well Sleeping - fairly well Concerns - refills   Declined influenza, Tetanus and HPV vaccinations   Medication Refill   ADHD   Subjective    [name] [name] is a 38 y.o. male who presents today for a complete physical exam.    Discussed the use of AI scribe software for clinical note transcription with the patient, who gave verbal consent to proceed.  History of Present Illness   [name] [name] is a 38 year old male who presents for an annual physical exam and evaluation of ongoing gastrointestinal issues.  He experiences constant upset stomach and daily diarrhea, worsened by certain foods he avoids during the workweek but consumes on weekends. Imodium is no longer effective. There is no blood in stool or significant weight loss, though a carnivore diet previously led to weight loss and symptom improvement. He takes over-the-counter omeprazole  due to insurance denials. High cholesterol influenced his decision to stop the carnivore diet.  He experiences fatigue, irritability, and low energy, attributed to poor sleep. He snores loudly and has episodes of apnea, particularly when sleeping on his back, sometimes waking himself up.        Last depression screening scores    07/11/2022    1:48 PM 02/18/2021    3:22 PM 09/17/2020    3:08 PM  PHQ 2/9 Scores  PHQ - 2 Score 0 0 1  PHQ- 9 Score 2 2 3    Last fall risk screening    06/06/2023    2:07 PM  Fall Risk   Falls in the past year? 0  Number falls in past yr: 0  Injury with Fall? 0        Medications: Outpatient Medications Prior to Visit  Medication Sig    albuterol  (VENTOLIN  HFA) 108 (90 Base) MCG/ACT inhaler Inhale 2 puffs into the lungs every 6 (six) hours as needed for wheezing or shortness of breath.   cetirizine  (ZYRTEC ) 10 MG tablet Take 10 mg by mouth daily.   fluticasone  (FLONASE ) 50 MCG/ACT nasal spray Place into both nostrils daily.   fluticasone  (FLONASE ) 50 MCG/ACT nasal spray Place 2 sprays into both nostrils daily.   omeprazole  (PRILOSEC) 20 MG capsule Take 1 capsule (20 mg total) by mouth daily. TAKE 1 CAPSULE BY MOUTH EVERY DAY   [DISCONTINUED] ALPRAZolam  (XANAX ) 1 MG tablet TAKE 1 TABLET (1 MG TOTAL) BY MOUTH DAILY AS NEEDED FOR ANXIETY. TAKE 1 TABLET(1 MG) BY MOUTH DAILY AS NEEDED   ketoconazole  (NIZORAL ) 2 % cream Apply 1 application topically 2 (two) times daily. (Patient not taking: Reported on 04/08/2024)   No facility-administered medications prior to visit.    Review of Systems    Objective    BP 122/79 (BP Location: Left Arm, Patient Position: Sitting, Cuff Size: Normal)   Pulse 76   Ht 6' (1.829 m)   Wt 198 lb 3.2 oz (89.9 kg)   SpO2 99%   BMI 26.88 kg/m    Physical Exam Vitals reviewed.  Constitutional:      General: He is not in acute distress.    Appearance:  Normal appearance. He is well-developed. He is not diaphoretic.  HENT:     Head: Normocephalic and atraumatic.     Right Ear: Tympanic membrane, ear canal and external ear normal.     Left Ear: Tympanic membrane, ear canal and external ear normal.     Nose: Nose normal.     Mouth/Throat:     Mouth: Mucous membranes are moist.     Pharynx: Oropharynx is clear. No oropharyngeal exudate.  Eyes:     General: No scleral icterus.    Conjunctiva/sclera: Conjunctivae normal.     Pupils: Pupils are equal, round, and reactive to light.  Neck:     Thyroid : No thyromegaly.  Cardiovascular:     Rate and Rhythm: Normal rate and regular rhythm.     Heart sounds: Normal heart sounds. No murmur heard. Pulmonary:     Effort: Pulmonary effort is normal. No  respiratory distress.     Breath sounds: Normal breath sounds. No wheezing or rales.  Abdominal:     General: There is no distension.     Palpations: Abdomen is soft.     Tenderness: There is no abdominal tenderness.  Musculoskeletal:        General: No deformity.     Cervical back: Neck supple.     Right lower leg: No edema.     Left lower leg: No edema.  Lymphadenopathy:     Cervical: No cervical adenopathy.  Skin:    General: Skin is warm and dry.     Findings: No rash.  Neurological:     Mental Status: He is alert and oriented to person, place, and time. Mental status is at baseline.     Gait: Gait normal.  Psychiatric:        Mood and Affect: Mood normal.        Behavior: Behavior normal.        Thought Content: Thought content normal.      No results found for any visits on 04/08/24.  Assessment & Plan    Routine Health Maintenance and Physical Exam  Exercise Activities and Dietary recommendations  Goals   None     Immunization History  Administered Date(s) Administered   DTaP 02/01/1988, 04/08/1988, 06/30/1988, 10/13/1989, 09/25/1992   Hepatitis B 04/12/1999, 05/17/1999, 10/11/1999   IPV 02/01/1988, 04/08/1988, 06/30/1988, 10/13/1989, 09/25/1992   MMR 03/10/1989, 09/25/1992   Tdap 10/28/2010    Health Maintenance  Topic Date Due   HPV VACCINES (1 - 3-dose SCDM series) Never done   DTaP/Tdap/Td (7 - Td or Tdap) 10/27/2020   COVID-19 Vaccine (1 - 2024-25 season) Never done   Influenza Vaccine  10/01/2024 (Originally 02/02/2024)   Hepatitis B Vaccines 19-59 Average Risk  Completed   Hepatitis C Screening  Completed   HIV Screening  Completed   Meningococcal B Vaccine  Aged Out   Pneumococcal Vaccine  Discontinued    Discussed health benefits of physical activity, and encouraged him to engage in regular exercise appropriate for his age and condition.  Problem List Items Addressed This Visit       Other   Anxiety   Uses Xanax  sparingly, approximately  fills twice a year. No immediate need for refill. - Provide Xanax  refill to be available at pharmacy for future use      Relevant Medications   ALPRAZolam  (XANAX ) 1 MG tablet   Fatigue   Relevant Orders   Comprehensive metabolic panel with GFR   Testosterone,Free and Total   CBC w/Diff/Platelet  TSH   VITAMIN D  25 Hydroxy (Vit-D Deficiency, Fractures)   Ambulatory referral to Sleep Studies   Vitamin D  deficiency   Relevant Orders   VITAMIN D  25 Hydroxy (Vit-D Deficiency, Fractures)   Moderate mixed hyperlipidemia not requiring statin therapy   Relevant Orders   Comprehensive metabolic panel with GFR   Lipid panel   Other Visit Diagnoses       Encounter for annual physical exam    -  Primary   Relevant Orders   Comprehensive metabolic panel with GFR   Lipid panel   Testosterone,Free and Total   CBC w/Diff/Platelet   TSH   VITAMIN D  25 Hydroxy (Vit-D Deficiency, Fractures)     Panic attack       Relevant Medications   ALPRAZolam  (XANAX ) 1 MG tablet     Irritability       Relevant Orders   Testosterone,Free and Total   CBC w/Diff/Platelet   TSH   VITAMIN D  25 Hydroxy (Vit-D Deficiency, Fractures)   Ambulatory referral to Sleep Studies     Witnessed episode of apnea       Relevant Orders   Ambulatory referral to Sleep Studies     Functional diarrhea       Relevant Orders   Ambulatory referral to Gastroenterology           Fatigue and irritability Reports low energy, increased irritability, and poor sleep. Considering low testosterone, thyroid  dysfunction, anemia, and vitamin D  deficiency as potential causes. - Order labs including testosterone, thyroid  function, blood counts, vitamin D , kidney and liver function, cholesterol, and blood sugar - Instruct to get labs done first thing in the morning for accurate testosterone levels  Suspected obstructive sleep apnea Reports symptoms suggestive of obstructive sleep apnea, including loud snoring, witnessed apneas,  and gasping during sleep. Symptoms may contribute to fatigue and low testosterone levels. - Order home sleep study - Discuss potential need for CPAP or other treatments if diagnosed  Irritable bowel syndrome, diarrhea predominant and chronic diarrhea Chronic diarrhea with dietary triggers, worsening over time. Differential includes IBS, Crohn's disease, ulcerative colitis, and colon cancer. Need to rule out serious conditions through colonoscopy. - Refer to GI for colonoscopy to rule out Crohn's disease, ulcerative colitis, and colon cancer  Gastroesophageal reflux disease (GERD) Chronic upset stomach, managed with dietary modifications. Previously used omeprazole , now purchasing over-the-counter due to insurance issues.  Adult Wellness Visit Routine adult wellness visit. Discussed insurance issues and confirmed that a physical is covered. Reviewed wellness and screening needs. - Update insurance information in MyChart when available - Discussed flu shot, declined today - Discussed tetanus shot, overdue by 3 years, prefers to schedule for another day - Discussed HPV vaccine, aware of the option but decided against it at this time - Discussed colon cancer screening to start at age 3       Return in about 1 year (around 04/08/2025) for CPE.     Jon Eva, MD  [facility] [phone] (phone) [phone] (fax)  [facility]

## 2024-04-16 ENCOUNTER — Ambulatory Visit: Payer: Self-pay | Admitting: Family Medicine

## 2024-04-16 LAB — LIPID PANEL
Chol/HDL Ratio: 6.3 ratio — ABNORMAL HIGH (ref 0.0–5.0)
Cholesterol, Total: 278 mg/dL — ABNORMAL HIGH (ref 100–199)
HDL: 44 mg/dL (ref >39)
LDL Chol Calc (NIH): 165 mg/dL — ABNORMAL HIGH (ref 0–99)
Triglycerides: 359 mg/dL — ABNORMAL HIGH (ref 0–149)
VLDL Cholesterol Cal: 69 mg/dL — ABNORMAL HIGH (ref 5–40)

## 2024-04-16 LAB — CBC WITH DIFFERENTIAL/PLATELET
Basophils Absolute: 0 x10E3/uL (ref 0.0–0.2)
Basos: 1 %
EOS (ABSOLUTE): 0.1 x10E3/uL (ref 0.0–0.4)
Eos: 1 %
Hematocrit: 49.2 % (ref 37.5–51.0)
Hemoglobin: 16.4 g/dL (ref 13.0–17.7)
Immature Grans (Abs): 0 x10E3/uL (ref 0.0–0.1)
Immature Granulocytes: 0 %
Lymphocytes Absolute: 2.3 x10E3/uL (ref 0.7–3.1)
Lymphs: 29 %
MCH: 30.1 pg (ref 26.6–33.0)
MCHC: 33.3 g/dL (ref 31.5–35.7)
MCV: 90 fL (ref 79–97)
Monocytes Absolute: 0.6 x10E3/uL (ref 0.1–0.9)
Monocytes: 8 %
Neutrophils Absolute: 5 x10E3/uL (ref 1.4–7.0)
Neutrophils: 61 %
Platelets: 213 x10E3/uL (ref 150–450)
RBC: 5.45 x10E6/uL (ref 4.14–5.80)
RDW: 12.8 % (ref 11.6–15.4)
WBC: 8.1 x10E3/uL (ref 3.4–10.8)

## 2024-04-16 LAB — COMPREHENSIVE METABOLIC PANEL WITH GFR
ALT: 26 IU/L (ref 0–44)
AST: 23 IU/L (ref 0–40)
Albumin: 4.6 g/dL (ref 4.1–5.1)
Alkaline Phosphatase: 89 IU/L (ref 47–123)
BUN/Creatinine Ratio: 16 (ref 9–20)
BUN: 18 mg/dL (ref 6–20)
Bilirubin Total: 0.3 mg/dL (ref 0.0–1.2)
CO2: 22 mmol/L (ref 20–29)
Calcium: 9.5 mg/dL (ref 8.7–10.2)
Chloride: 102 mmol/L (ref 96–106)
Creatinine, Ser: 1.15 mg/dL (ref 0.76–1.27)
Globulin, Total: 2.3 g/dL (ref 1.5–4.5)
Glucose: 95 mg/dL (ref 70–99)
Potassium: 4.1 mmol/L (ref 3.5–5.2)
Sodium: 137 mmol/L (ref 134–144)
Total Protein: 6.9 g/dL (ref 6.0–8.5)
eGFR: 85 mL/min/1.73 (ref >59)

## 2024-04-16 LAB — TESTOSTERONE,FREE AND TOTAL
Testosterone, Free: 32.2 pg/mL — ABNORMAL HIGH (ref 8.7–25.1)
Testosterone: 864 ng/dL (ref 264–916)

## 2024-04-16 LAB — TSH: TSH: 2.86 u[IU]/mL (ref 0.450–4.500)

## 2024-04-16 LAB — VITAMIN D 25 HYDROXY (VIT D DEFICIENCY, FRACTURES): Vit D, 25-Hydroxy: 23.6 ng/mL — ABNORMAL LOW (ref 30.0–100.0)

## 2024-05-17 ENCOUNTER — Encounter: Payer: Self-pay | Admitting: Family Medicine

## 2024-06-13 NOTE — Progress Notes (Deleted)
 Cardiology Office Note    Date:  06/13/2024   ID:  [name] [name], DOB [birth year], MRN [id number]  PCP:  Myrla Jon HERO, MD  Cardiologist:  Deatrice Cage, MD  Electrophysiologist:  None   Chief Complaint: ***  History of Present Illness:   [name] [name] is a 38 y.o. male with history of ***  ***   Labs independently reviewed: 04/2024 - TSH normal, Hgb 16.4, PLT 213, TC 278, TG 359, HDL 44, LDL 165, BUN 18, serum creatinine 1.15, potassium 4.1, albumin 4.6, AST/ALT normal 01/2024 - D-dimer negative, high-sensitivity troponin negative  Past Medical History:  Diagnosis Date   Acid reflux    Allergy    Anxiety    Bicuspid aortic valve 04/2020   Heart murmur     Past Surgical History:  Procedure Laterality Date   LUMBAR DISC SURGERY  11/17/2021   L5-S1   VASECTOMY      Current Medications: Active Medications[1]  Allergies:   Erythromycin   Social History   Socioeconomic History   Marital status: Married    Spouse name: Not on file   Number of children: 3   Years of education: Not on file   Highest education level: Not on file  Occupational History    Comment: city of [place]  Tobacco Use   Smoking status: Never   Smokeless tobacco: Current    Types: Chew  Vaping Use   Vaping status: Never Used  Substance and Sexual Activity   Alcohol use: Yes    Alcohol/week: 1.0 - 4.0 standard drink of alcohol    Types: 1 - 4 Cans of beer per week   Drug use: No   Sexual activity: Yes    Partners: Female    Birth control/protection: Surgical  Other Topics Concern   Not on file  Social History Narrative   Not on file   Social Drivers of Health   Tobacco Use: High Risk (04/08/2024)   Patient History    Smoking Tobacco Use: Never    Smokeless Tobacco Use: Current    Passive Exposure: Not on file  Financial Resource Strain: Not on file  Food Insecurity: Not on file  Transportation Needs: Not on file  Physical Activity: Not on file  Stress: Not on  file  Social Connections: Not on file  Depression (PHQ2-9): Low Risk (07/11/2022)   Depression (PHQ2-9)    PHQ-2 Score: 2  Alcohol Screen: Low Risk (07/11/2022)   Alcohol Screen    Last Alcohol Screening Score (AUDIT): 2  Housing: Not on file  Utilities: Not on file  Health Literacy: Not on file     Family History:  The patient's family history includes Anxiety disorder in his mother; Cancer in his paternal grandmother; Diabetes in his father and paternal grandfather; Heart Problems in his maternal grandmother; Heart murmur in his mother; Hypothyroidism in his sister; Thyroid  disease in his mother. There is no history of Prostate cancer, Kidney cancer, Bladder Cancer, Breast cancer, or Colon cancer.  ROS:   12-point review of systems is negative unless otherwise noted in the HPI.   EKGs/Labs/Other Studies Reviewed:    Studies reviewed were summarized above. The additional studies were reviewed today:  Zio patch 07/2020: Patient had a min HR of 46 bpm, max HR of 163 bpm, and avg HR of 86 bpm. Predominant underlying rhythm was Sinus Rhythm. Rare PACs and rare PVCs. Triggered events correlated mostly with sinus tachycardia. Overall, no significant arrhythmia. __________  2D echo 04/02/2020:  1. Left ventricular ejection fraction, by estimation, is 55 to 60%. The  left ventricle has normal function. The left ventricle has no regional  wall motion abnormalities. Left ventricular diastolic parameters were  normal.   2. Right ventricular systolic function is normal. The right ventricular  size is normal.   3. The mitral valve is normal in structure. No evidence of mitral valve  regurgitation. No evidence of mitral stenosis.   4. Bicuspid aortic valve type 1 (raphe/calcification between R-L cusp).  The aortic valve is bicuspid. Aortic valve regurgitation is mild. Mild  aortic valve sclerosis is present, with no evidence of aortic valve  stenosis.   5. The inferior vena cava is normal in  size with greater than 50%  respiratory variability, suggesting right atrial pressure of 3 mmHg.    EKG:  EKG is ordered today.  The EKG ordered today demonstrates ***  Recent Labs: 04/15/2024: ALT 26; BUN 18; Creatinine, Ser 1.15; Hemoglobin 16.4; Platelets 213; Potassium 4.1; Sodium 137; TSH 2.860  Recent Lipid Panel    Component Value Date/Time   CHOL 278 (H) 04/15/2024 0820   TRIG 359 (H) 04/15/2024 0820   HDL 44 04/15/2024 0820   CHOLHDL 6.3 (H) 04/15/2024 0820   LDLCALC 165 (H) 04/15/2024 0820    PHYSICAL EXAM:    VS:  There were no vitals taken for this visit.  BMI: There is no height or weight on file to calculate BMI.  Physical Exam  Wt Readings from Last 3 Encounters:  04/08/24 198 lb 3.2 oz (89.9 kg)  01/16/24 187 lb (84.8 kg)  12/07/23 189 lb (85.7 kg)     ASSESSMENT & PLAN:   ***   {Are you ordering a CV Procedure (e.g. stress test, cath, DCCV, TEE, etc)?   Press F2        :[id number]}     Disposition: F/u with Dr. Darron or an APP in ***.   Medication Adjustments/Labs and Tests Ordered: Current medicines are reviewed at length with the patient today.  Concerns regarding medicines are outlined above. Medication changes, Labs and Tests ordered today are summarized above and listed in the Patient Instructions accessible in Encounters.   Signed, [name] Bring, [name] [name] 06/13/2024 4:13 PM     [facility] [place] [address] [place], [place] [address] [phone]    [1]  No outpatient medications have been marked as taking for the 06/20/24 encounter (Appointment) with [name] [name] HERO, [name] [name].

## 2024-06-14 ENCOUNTER — Ambulatory Visit

## 2024-06-18 ENCOUNTER — Encounter: Payer: Self-pay | Admitting: Cardiovascular Disease

## 2024-06-18 ENCOUNTER — Ambulatory Visit: Attending: Cardiovascular Disease | Admitting: Cardiovascular Disease

## 2024-06-18 VITALS — BP 118/68 | HR 67 | Ht 72.0 in | Wt 199.6 lb

## 2024-06-18 DIAGNOSIS — Q2381 Bicuspid aortic valve: Secondary | ICD-10-CM

## 2024-06-18 DIAGNOSIS — R079 Chest pain, unspecified: Secondary | ICD-10-CM | POA: Insufficient documentation

## 2024-06-18 DIAGNOSIS — E785 Hyperlipidemia, unspecified: Secondary | ICD-10-CM | POA: Diagnosis present

## 2024-06-18 NOTE — Progress Notes (Unsigned)
 Cardiology Office Note   Date:  06/18/2024   ID:  [name] [name], DOB 1986/01/14, MRN [id number]  PCP:  Myrla Jon HERO, MD  Cardiologist:   Deatrice Cage, MD   Chief Complaint  Patient presents with   New Patient (Initial Visit)    New pt/ referred by Dr Myrla Jon, MD for chest pain.  pt has been doing well with no complaints of chest pain, chest pressure or SOB, medication reviewed verbally with patient .       History of Present Illness: [name] [name] is a 38 y.o. male who is here today to reestablish cardiovascular care.  He was seen in 2021 for atypical chest pain in the setting of anxiety.  We requested a treadmill stress test for evaluation but symptoms resolved and the test was canceled.  An echocardiogram was done and showed evidence of bicuspid aortic valve with mild regurgitation.  He was most recently seen in our office in 2022 for palpitations.  Outpatient monitor showed no significant arrhythmia.   He had an episode of chest pain in July that happened after he had a meal.  Symptoms persisted and was associated with significant anxiety.  Thus, he went to the ED for evaluation where troponin and D-dimer were both negative.  He was reassured and he had no recurrent symptoms since that time. He does have hyperlipidemia not on any medication.   Past Medical History:  Diagnosis Date   Acid reflux    Allergy    Anxiety    Bicuspid aortic valve 04/2020   Heart murmur     Past Surgical History:  Procedure Laterality Date   LUMBAR DISC SURGERY  11/17/2021   L5-S1   VASECTOMY       Current Outpatient Medications  Medication Sig Dispense Refill   ALPRAZolam  (XANAX ) 1 MG tablet Take 1 tablet (1 mg total) by mouth daily as needed for anxiety. TAKE 1 TABLET(1 MG) BY MOUTH DAILY AS NEEDED 15 tablet 1   omeprazole  (PRILOSEC) 20 MG capsule Take 1 capsule (20 mg total) by mouth daily. TAKE 1 CAPSULE BY MOUTH EVERY DAY 90 capsule 0   albuterol  (VENTOLIN  HFA)  108 (90 Base) MCG/ACT inhaler Inhale 2 puffs into the lungs every 6 (six) hours as needed for wheezing or shortness of breath. (Patient not taking: Reported on 06/18/2024) 8 g 2   cetirizine  (ZYRTEC ) 10 MG tablet Take 10 mg by mouth daily. (Patient not taking: Reported on 06/18/2024)     fluticasone  (FLONASE ) 50 MCG/ACT nasal spray Place into both nostrils daily. (Patient not taking: Reported on 06/18/2024)     fluticasone  (FLONASE ) 50 MCG/ACT nasal spray Place 2 sprays into both nostrils daily. (Patient not taking: Reported on 06/18/2024) 16 g 6   ketoconazole  (NIZORAL ) 2 % cream Apply 1 application topically 2 (two) times daily. (Patient not taking: Reported on 06/18/2024) 60 g 1   No current facility-administered medications for this visit.    Allergies:   Erythromycin    Social History:  The patient  reports that he has never smoked. His smokeless tobacco use includes chew. He reports current alcohol use of about 1.0 - 4.0 standard drink of alcohol per week. He reports that he does not use drugs.   Family History:  The patient's family history includes Anxiety disorder in his mother; Cancer in his paternal grandmother; Diabetes in his father and paternal grandfather; Heart Problems in his maternal grandmother; Heart murmur in his mother; Hypothyroidism in his sister;  Thyroid  disease in his mother.    ROS:  Please see the history of present illness.   Otherwise, review of systems are positive for none.   All other systems are reviewed and negative.    PHYSICAL EXAM: VS:  BP 118/68 (BP Location: Right Arm, Patient Position: Sitting, Cuff Size: Normal)   Pulse 67   Ht 6' (1.829 m)   Wt 199 lb 9.6 oz (90.5 kg)   SpO2 97%   BMI 27.07 kg/m  , BMI Body mass index is 27.07 kg/m. GEN: Well nourished, well developed, in no acute distress  HEENT: normal  Neck: no JVD, carotid bruits, or masses Cardiac: RRR; no rubs, or gallops,no edema .  1 out of 6 systolic murmur in the aortic  area Respiratory:  clear to auscultation bilaterally, normal work of breathing GI: soft, nontender, nondistended, + BS MS: no deformity or atrophy  Skin: warm and dry, no rash Neuro:  Strength and sensation are intact Psych: euthymic mood, full affect   EKG:  EKG is ordered today. The ekg ordered today demonstrates: Normal sinus rhythm Normal ECG When compared with ECG of 16-Jan-2024 15:29, No significant change was found    Recent Labs: 04/15/2024: ALT 26; BUN 18; Creatinine, Ser 1.15; Hemoglobin 16.4; Platelets 213; Potassium 4.1; Sodium 137; TSH 2.860    Lipid Panel    Component Value Date/Time   CHOL 278 (H) 04/15/2024 0820   TRIG 359 (H) 04/15/2024 0820   HDL 44 04/15/2024 0820   CHOLHDL 6.3 (H) 04/15/2024 0820   LDLCALC 165 (H) 04/15/2024 0820      Wt Readings from Last 3 Encounters:  06/18/24 199 lb 9.6 oz (90.5 kg)  04/08/24 198 lb 3.2 oz (89.9 kg)  01/16/24 187 lb (84.8 kg)           06/18/2024    4:27 PM 03/10/2020    1:58 PM  PAD Screen  Previous PAD dx? No No  Previous surgical procedure? No No  Pain with walking? No No  Feet/toe relief with dangling? No No  Painful, non-healing ulcers? No No  Extremities discolored?  No      ASSESSMENT AND PLAN:  1.  Atypical chest pain: No recurrent symptoms since July.  His EKG is normal.  No need for ischemic cardiac evaluation at the present time.  2.  Bicuspid aortic valve: This was associated with mild regurgitation.  I requested a follow-up echocardiogram.  3.  Hyperlipidemia: Recent labs showed an LDL of 165.  He has been following a healthy lifestyle since then.   Disposition:   FU with me in 1 year.  Signed,  Deatrice Cage, MD  06/18/2024 4:41 PM     [facility]

## 2024-06-18 NOTE — Patient Instructions (Signed)
 Medication Instructions:  No changes *If you need a refill on your cardiac medications before your next appointment, please call your pharmacy*  Lab Work None ordered If you have labs (blood work) drawn today and your tests are completely normal, you will receive your results only by: MyChart Message (if you have MyChart) OR A paper copy in the mail If you have any lab test that is abnormal or we need to change your treatment, we will call you to review the results.  Testing/Procedures: Your physician has requested that you have an echocardiogram. Echocardiography is a painless test that uses sound waves to create images of your heart. It provides your doctor with information about the size and shape of your heart and how well your heart's chambers and valves are working.   You may receive an ultrasound enhancing agent through an IV if needed to better visualize your heart during the echo. This procedure takes approximately one hour.  There are no restrictions for this procedure.  This will take place at [address] [facility] [address], [place] [address]  Please note: We ask at that you not bring children with you during ultrasound (echo/ vascular) testing. Due to room size and safety concerns, children are not allowed in the ultrasound rooms during exams. Our front office staff cannot provide observation of children in our lobby area while testing is being conducted. An adult accompanying a patient to their appointment will only be allowed in the ultrasound room at the discretion of the ultrasound technician under special circumstances. We apologize for any inconvenience.   Follow-Up: At [facility], you and your health needs are our priority.  As part of our continuing mission to provide you with exceptional heart care, our providers are all part of one team.  This team includes your primary Cardiologist (physician) and Advanced Practice Providers or APPs  (Physician Assistants and Nurse Practitioners) who all work together to provide you with the care you need, when you need it.  Your next appointment:   12 month(s)  Provider:   You may see Antionette Kirks, MD or one of the following Advanced Practice Providers on your designated Care Team:   Laneta Pintos, NP Gildardo Labrador, PA-C Varney Gentleman, PA-C Cadence Hampstead, PA-C Ronald Cockayne, NP Morey Ar, NP    We recommend signing up for the patient portal called "MyChart".  Sign up information is provided on this After Visit Summary.  MyChart is used to connect with patients for Virtual Visits (Telemedicine).  Patients are able to view lab/test results, encounter notes, upcoming appointments, etc.  Non-urgent messages can be sent to your provider as well.   To learn more about what you can do with MyChart, go to [email or web address].

## 2024-06-20 ENCOUNTER — Ambulatory Visit: Admitting: Physician Assistant

## 2024-07-15 ENCOUNTER — Ambulatory Visit: Attending: Cardiovascular Disease

## 2024-07-30 ENCOUNTER — Ambulatory Visit

## 2024-07-30 DIAGNOSIS — Q2381 Bicuspid aortic valve: Secondary | ICD-10-CM | POA: Insufficient documentation

## 2024-07-30 LAB — ECHOCARDIOGRAM COMPLETE
AR max vel: 2.1 cm2
AV Area VTI: 2.44 cm2
AV Area mean vel: 2.16 cm2
AV Mean grad: 6 mmHg
AV Peak grad: 10.4 mmHg
Ao pk vel: 1.61 m/s
Area-P 1/2: 3.85 cm2
S' Lateral: 3.13 cm

## 2024-07-31 ENCOUNTER — Ambulatory Visit: Payer: Self-pay | Admitting: Cardiovascular Disease

## 2025-04-10 ENCOUNTER — Encounter: Payer: Self-pay | Admitting: Family Medicine
